# Patient Record
Sex: Female | Born: 1950 | Race: White | Hispanic: No | Smoking: Current every day smoker
Health system: Southern US, Community
[De-identification: ages and names within clinical notes are randomized; demographics above are authoritative.]

## PROBLEM LIST (undated history)

## (undated) DIAGNOSIS — R5382 Chronic fatigue, unspecified: Secondary | ICD-10-CM

## (undated) DIAGNOSIS — M797 Fibromyalgia: Secondary | ICD-10-CM

## (undated) DIAGNOSIS — Z808 Family history of malignant neoplasm of other organs or systems: Secondary | ICD-10-CM

## (undated) DIAGNOSIS — T8859XA Other complications of anesthesia, initial encounter: Secondary | ICD-10-CM

## (undated) DIAGNOSIS — J449 Chronic obstructive pulmonary disease, unspecified: Secondary | ICD-10-CM

## (undated) DIAGNOSIS — G9332 Myalgic encephalomyelitis/chronic fatigue syndrome: Secondary | ICD-10-CM

## (undated) DIAGNOSIS — M810 Age-related osteoporosis without current pathological fracture: Secondary | ICD-10-CM

## (undated) DIAGNOSIS — C50919 Malignant neoplasm of unspecified site of unspecified female breast: Secondary | ICD-10-CM

## (undated) DIAGNOSIS — Z85828 Personal history of other malignant neoplasm of skin: Secondary | ICD-10-CM

## (undated) HISTORY — PX: TONSILLECTOMY: SUR1361

## (undated) HISTORY — PX: EYE SURGERY: SHX253

## (undated) HISTORY — PX: DILATION AND CURETTAGE OF UTERUS: SHX78

## (undated) HISTORY — PX: TUBAL LIGATION: SHX77

## (undated) HISTORY — DX: Family history of malignant neoplasm of other organs or systems: Z80.8

## (undated) HISTORY — PX: ABDOMINAL HYSTERECTOMY: SHX81

## (undated) HISTORY — PX: INCONTINENCE SURGERY: SHX676

## (undated) HISTORY — DX: Personal history of other malignant neoplasm of skin: Z85.828

## (undated) HISTORY — PX: TOTAL MASTECTOMY: SHX6129

---

## 1983-01-20 HISTORY — PX: OTHER SURGICAL HISTORY: SHX169

## 1996-01-20 HISTORY — PX: ABDOMINAL HYSTERECTOMY: SHX81

## 2012-08-08 DIAGNOSIS — R0602 Shortness of breath: Secondary | ICD-10-CM

## 2015-01-16 ENCOUNTER — Other Ambulatory Visit: Payer: Self-pay | Admitting: Family Medicine

## 2015-01-16 DIAGNOSIS — R921 Mammographic calcification found on diagnostic imaging of breast: Secondary | ICD-10-CM

## 2015-01-22 ENCOUNTER — Ambulatory Visit
Admission: RE | Admit: 2015-01-22 | Discharge: 2015-01-22 | Disposition: A | Discharge status: Home / Self Care | Payer: Medicare HMO | Source: Ambulatory Visit | Attending: Family Medicine | Admitting: Family Medicine

## 2015-01-22 DIAGNOSIS — R921 Mammographic calcification found on diagnostic imaging of breast: Secondary | ICD-10-CM

## 2015-03-14 DIAGNOSIS — F419 Anxiety disorder, unspecified: Secondary | ICD-10-CM | POA: Insufficient documentation

## 2015-03-14 DIAGNOSIS — C50912 Malignant neoplasm of unspecified site of left female breast: Secondary | ICD-10-CM | POA: Insufficient documentation

## 2015-03-14 DIAGNOSIS — M810 Age-related osteoporosis without current pathological fracture: Secondary | ICD-10-CM | POA: Insufficient documentation

## 2015-10-16 ENCOUNTER — Other Ambulatory Visit (HOSPITAL_COMMUNITY): Payer: Self-pay | Admitting: Oncology

## 2015-12-18 ENCOUNTER — Ambulatory Visit (HOSPITAL_COMMUNITY): Payer: Medicare HMO | Admitting: Oncology

## 2015-12-18 ENCOUNTER — Ambulatory Visit (HOSPITAL_COMMUNITY): Payer: Medicare HMO

## 2015-12-20 ENCOUNTER — Encounter (HOSPITAL_COMMUNITY): Payer: Medicare HMO | Admitting: Hematology & Oncology

## 2016-01-27 ENCOUNTER — Ambulatory Visit (HOSPITAL_COMMUNITY): Payer: Medicare HMO | Admitting: Hematology & Oncology

## 2016-02-17 ENCOUNTER — Ambulatory Visit (HOSPITAL_COMMUNITY): Payer: Medicare HMO

## 2016-03-12 ENCOUNTER — Encounter (HOSPITAL_COMMUNITY): Payer: Self-pay | Admitting: Lab

## 2016-03-12 ENCOUNTER — Encounter (HOSPITAL_COMMUNITY): Payer: Medicare HMO | Attending: Oncology | Admitting: Oncology

## 2016-03-12 ENCOUNTER — Encounter (HOSPITAL_COMMUNITY): Payer: Medicare HMO

## 2016-03-12 ENCOUNTER — Encounter (HOSPITAL_COMMUNITY): Payer: Self-pay

## 2016-03-12 VITALS — BP 119/52 | HR 73 | Temp 98.5°F | Resp 18 | Ht 59.0 in | Wt 147.1 lb

## 2016-03-12 DIAGNOSIS — M170 Bilateral primary osteoarthritis of knee: Secondary | ICD-10-CM | POA: Diagnosis not present

## 2016-03-12 DIAGNOSIS — Z90710 Acquired absence of both cervix and uterus: Secondary | ICD-10-CM | POA: Insufficient documentation

## 2016-03-12 DIAGNOSIS — F17200 Nicotine dependence, unspecified, uncomplicated: Secondary | ICD-10-CM | POA: Insufficient documentation

## 2016-03-12 DIAGNOSIS — Z9889 Other specified postprocedural states: Secondary | ICD-10-CM | POA: Diagnosis not present

## 2016-03-12 DIAGNOSIS — M797 Fibromyalgia: Secondary | ICD-10-CM | POA: Insufficient documentation

## 2016-03-12 DIAGNOSIS — N3020 Other chronic cystitis without hematuria: Secondary | ICD-10-CM | POA: Insufficient documentation

## 2016-03-12 DIAGNOSIS — Z888 Allergy status to other drugs, medicaments and biological substances status: Secondary | ICD-10-CM | POA: Insufficient documentation

## 2016-03-12 DIAGNOSIS — K219 Gastro-esophageal reflux disease without esophagitis: Secondary | ICD-10-CM | POA: Diagnosis not present

## 2016-03-12 DIAGNOSIS — Z853 Personal history of malignant neoplasm of breast: Secondary | ICD-10-CM | POA: Diagnosis not present

## 2016-03-12 DIAGNOSIS — F17210 Nicotine dependence, cigarettes, uncomplicated: Secondary | ICD-10-CM | POA: Diagnosis not present

## 2016-03-12 DIAGNOSIS — F419 Anxiety disorder, unspecified: Secondary | ICD-10-CM | POA: Insufficient documentation

## 2016-03-12 DIAGNOSIS — Z79899 Other long term (current) drug therapy: Secondary | ICD-10-CM | POA: Diagnosis not present

## 2016-03-12 DIAGNOSIS — Z720 Tobacco use: Secondary | ICD-10-CM

## 2016-03-12 DIAGNOSIS — J449 Chronic obstructive pulmonary disease, unspecified: Secondary | ICD-10-CM | POA: Insufficient documentation

## 2016-03-12 DIAGNOSIS — I10 Essential (primary) hypertension: Secondary | ICD-10-CM

## 2016-03-12 DIAGNOSIS — C50912 Malignant neoplasm of unspecified site of left female breast: Secondary | ICD-10-CM | POA: Diagnosis not present

## 2016-03-12 DIAGNOSIS — J309 Allergic rhinitis, unspecified: Secondary | ICD-10-CM | POA: Insufficient documentation

## 2016-03-12 DIAGNOSIS — Z9012 Acquired absence of left breast and nipple: Secondary | ICD-10-CM | POA: Diagnosis not present

## 2016-03-12 DIAGNOSIS — J3089 Other allergic rhinitis: Secondary | ICD-10-CM

## 2016-03-12 LAB — CBC WITH DIFFERENTIAL/PLATELET
BASOS ABS: 0 10*3/uL (ref 0.0–0.1)
Basophils Relative: 0 %
Eosinophils Absolute: 0.1 10*3/uL (ref 0.0–0.7)
Eosinophils Relative: 1 %
HEMATOCRIT: 40.9 % (ref 36.0–46.0)
Hemoglobin: 13.3 g/dL (ref 12.0–15.0)
LYMPHS ABS: 3.3 10*3/uL (ref 0.7–4.0)
LYMPHS PCT: 53 %
MCH: 30.2 pg (ref 26.0–34.0)
MCHC: 32.5 g/dL (ref 30.0–36.0)
MCV: 93 fL (ref 78.0–100.0)
Monocytes Absolute: 0.5 10*3/uL (ref 0.1–1.0)
Monocytes Relative: 8 %
Neutro Abs: 2.4 10*3/uL (ref 1.7–7.7)
Neutrophils Relative %: 38 %
Platelets: 257 10*3/uL (ref 150–400)
RBC: 4.4 MIL/uL (ref 3.87–5.11)
RDW: 12.3 % (ref 11.5–15.5)
WBC: 6.3 10*3/uL (ref 4.0–10.5)

## 2016-03-12 LAB — COMPREHENSIVE METABOLIC PANEL
ALK PHOS: 57 U/L (ref 38–126)
ALT: 22 U/L (ref 14–54)
AST: 31 U/L (ref 15–41)
Albumin: 4.3 g/dL (ref 3.5–5.0)
Anion gap: 7 (ref 5–15)
BILIRUBIN TOTAL: 0.4 mg/dL (ref 0.3–1.2)
BUN: 9 mg/dL (ref 6–20)
CALCIUM: 9.5 mg/dL (ref 8.9–10.3)
CHLORIDE: 98 mmol/L — AB (ref 101–111)
CO2: 30 mmol/L (ref 22–32)
CREATININE: 0.56 mg/dL (ref 0.44–1.00)
GFR calc Af Amer: >60 mL/min (ref >60)
Glucose, Bld: 95 mg/dL (ref 65–99)
Potassium: 4.4 mmol/L (ref 3.5–5.1)
Sodium: 135 mmol/L (ref 135–145)
Total Protein: 7.2 g/dL (ref 6.5–8.1)

## 2016-03-12 NOTE — Patient Instructions (Addendum)
[place] at [facility] Discharge Instructions  RECOMMENDATIONS MADE BY THE CONSULTANT AND ANY TEST RESULTS WILL BE SENT TO YOUR REFERRING PHYSICIAN.  You were seen today by Dr. Twana First You will get labs drawn today, we will call you with results We will refer you to plastic surgeon Mammogram will be scheduled for next week Follow up in 3 months See Amy up front for appointments   Thank you for choosing [place] at [facility] to provide your oncology and hematology care.  To afford each patient quality time with our provider, please arrive at least 15 minutes before your scheduled appointment time.    If you have a lab appointment with the [facility] please come in thru the  Main Entrance and check in at the main information desk  You need to re-schedule your appointment should you arrive 10 or more minutes late.  We strive to give you quality time with our providers, and arriving late affects you and other patients whose appointments are after yours.  Also, if you no show three or more times for appointments you may be dismissed from the clinic at the providers discretion.     Again, thank you for choosing [facility].  Our hope is that these requests will decrease the amount of time that you wait before being seen by our physicians.       _____________________________________________________________  Should you have questions after your visit to [facility], please contact our office at [phone] between the hours of 8:30 a.m. and 4:30 p.m.  Voicemails left after 4:30 p.m. will not be returned until the following business day.  For prescription refill requests, have your pharmacy contact our office.       Resources For Cancer Patients and their Caregivers ? American Cancer Society: Can assist with transportation, wigs, general needs, runs Look Good Feel Better.        8567985924 ? Cancer  Care: Provides financial assistance, online support groups, medication/co-pay assistance.  1-800-813-HOPE 252-690-9619) ? [place] Assists [place] cancer patients and their families through emotional , educational and financial support.  (515)696-3529 ? [place] DSS Where to apply for food stamps, Medicaid and utility assistance. 403-555-3132 ? RCATS: Transportation to medical appointments. (702)549-8011 ? Social Security Administration: May apply for disability if have a Stage IV cancer. 413-657-2474 979-508-8011 ? [employer], Disability and Transit Services: Assists with nutrition, care and transit needs. [facility] Support Programs: @10RELATIVEDAYS @ > Cancer Support Group  2nd Tuesday of the month 1pm-2pm, Journey Room  > Creative Journey  3rd Tuesday of the month 1130am-1pm, Journey Room  > Look Good Feel Better  1st Wednesday of the month 10am-12 noon, Journey Room (Call [facility] to register [phone])

## 2016-03-12 NOTE — Progress Notes (Signed)
[place]  CONSULT NOTE  No care team member to display  CHIEF COMPLAINTS/PURPOSE OF CONSULTATION:  Left breast cancer stage IA ([id number])  HISTORY OF PRESENTING ILLNESS:  Virginia Blake 66 y.o. female is here because of referral by Dr. 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   Oncologic History from [facility]:   01/09/2015: She had a digital diagnostic left mammogram ultrasound of the left breast done which showed a hypoechoic oval mass with posterior acoustic shadowing. It measures 7.5 x 6.1 x 6.2 mm. It lies in the 9:30 o'clock position of the left breast, 3 cm from the nipple.    01/16/15: Ultrasound guided biopsy of mass which demonstrated invasive ductal carcinoma.   02/05/15: Left mastectomy and SLN biopsy by Dr. Burke Keels. Surgical path: invasive ductal carcinoma, well differentiated 0.6 cm in maximal dimension, coming to 1.2 cm of the closest (deep) resection margin. +LVI. G2. ER/PR + HER2 negative. 0/1 SLN positive. (pT1b pN0)  Patient was told to start adjuvant endocrine therapy with tamoxifen however she stated she read the package insert and saw all the side effects and decided to decline therapy. She currently still does not want any adjuvant endocrine therapy. She has done well post mastectomy. She has not had a mammogram of her right breast in the past year. She denies noting any right breast mass or subcutaneous nodules on her left chest wall. She states she recently had the flu a few weeks ago and is still recovering from that. Otherwise she has no complaints. She continues to smoke 1 ppd.      MEDICAL HISTORY:   HTN COPD Left breast CA s/p left total mastectomy with SLN bx Allergic rhinitis osteoporosis  SURGICAL HISTORY: Past Surgical History:  Procedure Laterality Date  . ABDOMINAL HYSTERECTOMY    . INCONTINENCE SURGERY    . TONSILLECTOMY    . TOTAL MASTECTOMY Left   . TUBAL LIGATION     SOCIAL HISTORY: Social History   Social History  . Marital status: Divorced    Spouse name: N/A  . Number of children: N/A  . Years of education: N/A   Occupational History  . Not on file.   Social History Main Topics  . Smoking status: Not on file  . Smokeless tobacco: Not on file  . Alcohol use Not on file  . Drug use: Unknown  . Sexual activity: Not on file   Other Topics Concern  . Not on file   Social History Narrative  . No narrative on file   Divorced  No drug use Non-drinker Current smoker of 40 pk/yr and has smoked for more than 20 years.  Works part time in [place]   FAMILY HISTORY: No family history of breast or ovarian Ca.  Father:, family history unknown Mother: alive, diagnosed with hypertension  ALLERGIES:  is allergic to ciprofloxacin.  MEDICATIONS:  Current Outpatient Prescriptions  Medication Sig Dispense Refill  . ALPRAZolam (XANAX) 1 MG tablet TK 1 T PO 5 TIMES PER DAY  5  . HYDROcodone-acetaminophen (NORCO) 10-325 MG tablet TAKE 1 TABLET BY MOUTH EVERY 4 HOURS  0  . ipratropium-albuterol (DUONEB) 0.5-2.5 (3) MG/3ML SOLN U 3 ML VIA NEB QID  11  .  Multiple Vitamin (THERA) TABS Take by mouth.     No current facility-administered medications for this visit.     Review of Systems  Constitutional: Negative.   HENT: Negative.   Eyes: Negative.   Respiratory: Negative.   Cardiovascular: Negative.   Gastrointestinal: Negative.   Genitourinary: Negative.   Musculoskeletal: Negative.   Skin: Negative.   Neurological: Negative.   Endo/Heme/Allergies: Negative.   Psychiatric/Behavioral: Negative.   All other systems reviewed and are negative.  14  point ROS was done and is otherwise as detailed above or in HPI   PHYSICAL EXAMINATION: ECOG PERFORMANCE STATUS: 0 - Asymptomatic  Vitals:   03/12/16 1316  BP: (!) 119/52  Pulse: 73  Resp: 18  Temp: 98.5 F (36.9 C)   Filed Weights   03/12/16 1316  Weight: 147 lb 1.6 oz (66.7 kg)     Physical Exam  Constitutional: She is oriented to person, place, and time and well-developed, well-nourished, and in no distress. No distress.  HENT:  Head: Normocephalic and atraumatic.  Mouth/Throat: No oropharyngeal exudate.  Eyes: Conjunctivae and EOM are normal. Pupils are equal, round, and reactive to light. No scleral icterus.  Neck: Normal range of motion. Neck supple. No JVD present.  Cardiovascular: Normal rate, regular rhythm and normal heart sounds.  Exam reveals no gallop and no friction rub.   No murmur heard. Pulmonary/Chest: Effort normal and breath sounds normal. No respiratory distress. She has no wheezes. She has no rales.    Abdominal: Soft. Bowel sounds are normal. She exhibits no distension. There is no tenderness. There is no guarding.  Musculoskeletal: Normal range of motion. She exhibits no edema or tenderness.  Lymphadenopathy:    She has no cervical adenopathy.  Neurological: She is alert and oriented to person, place, and time. No cranial nerve deficit. Gait normal.  Skin: Skin is warm and dry. No rash noted. No erythema. No pallor.  Psychiatric: Affect and judgment normal.  Nursing note and vitals reviewed.     LABORATORY DATA:  I have reviewed the data as listed No results found for: WBC, HGB, HCT, MCV, PLT CMP  No results found for: NA, K, CL, CO2, GLUCOSE, BUN, CREATININE, CALCIUM, PROT, ALBUMIN, AST, ALT, ALKPHOS, BILITOT, GFRNONAA, GFRAA   RADIOGRAPHIC STUDIES: I have personally reviewed the radiological images as listed and agreed with the findings in the report. No results found.   Digital Diagnostic Left Mammogram Ultrasound Left Breast  01/09/2015  IMPRESSION:  7.5 mm left breast mass highly suspicious for a breast malignancy.     US BIOPSY BREAST CORE W/ IMAGES 01/16/2015  IMPRESSION: Ultrasound guided biopsy of the left breast mass located at the 9:30 o'clock position as dicussed above. No apparent complications.     MM POST US BIOPSY *L*  01/16/2015  IMPRESSION: Appropriate positioning of clip following left breast ultrasound-guided biopsy   CT CHEST W/O CM 04/30/2015  IMPRESSION: 1. Suboptimal study without IV contrast. No mediastinal hematoma or adenopathy. No hilar adenopathy.  2. No infiltrate or pulmonary edema. Previous nodular infiltrates in right middle lobe and lingula have resolved. Minimal perihilar bronchitic changes.  3. No pleural or pericardial effusion. Heart size is within normal limits. 4. Status post left mastectomy. No axillary adenopathy.   ASSESSMENT & PLAN:  66 year old postmenopausal female with Stage IA left breast CA s/p left total mastectomy. Refused adjuvant endocrine therapy.   PLAN: Continue surveillance at this time. Clinically NED on exam. Ordered right mammogram for yearly screening mammogram. CBC,  CMP today. Refer to plastic surgery since patient is interested in left breast reconstruction. RTC in 3 months for exam and follow up.      ORDERS PLACED FOR THIS ENCOUNTER: No orders of the defined types were placed in this encounter.   MEDICATIONS PRESCRIBED THIS ENCOUNTER: Meds ordered this encounter  Medications  . ALPRAZolam (XANAX) 1 MG tablet    Sig: TK 1 T PO 5 TIMES PER DAY    Refill:  5  . HYDROcodone-acetaminophen (NORCO) 10-325 MG tablet    Sig: TAKE 1 TABLET BY MOUTH EVERY 4 HOURS    Refill:  0  . ipratropium-albuterol (DUONEB) 0.5-2.5 (3) MG/3ML SOLN    Sig: U 3 ML VIA NEB QID    Refill:  11  . Multiple Vitamin (THERA) TABS    Sig: Take by mouth.     All questions were answered. The patient knows to call the clinic with any problems, questions  or concerns.  This document serves as a record of services personally performed by Twana First, MD. It was created on her behalf by Shirlean Mylar, a trained medical scribe. The creation of this record is based on the scribe's personal observations and the provider's statements to them. This document has been checked and approved by the attending provider.  I have reviewed the above documentation for accuracy and completeness and I agree with the above.  This note was electronically signed.    [employer]  03/12/2016 1:31 PM

## 2016-03-12 NOTE — Progress Notes (Unsigned)
Referral sent to Dr Leland Johns plastic surg/ records faxed on 2/22.  Records faxed on 2/22

## 2016-03-16 ENCOUNTER — Other Ambulatory Visit (HOSPITAL_COMMUNITY): Payer: Self-pay | Admitting: Oncology

## 2016-03-16 DIAGNOSIS — Z1231 Encounter for screening mammogram for malignant neoplasm of breast: Secondary | ICD-10-CM

## 2016-03-16 DIAGNOSIS — Z1239 Encounter for other screening for malignant neoplasm of breast: Secondary | ICD-10-CM

## 2016-03-17 ENCOUNTER — Encounter (HOSPITAL_COMMUNITY): Payer: Self-pay

## 2016-03-17 ENCOUNTER — Encounter: Payer: Self-pay | Admitting: *Deleted

## 2016-03-17 NOTE — Progress Notes (Signed)
[place] Psychosocial Distress Screening Clinical Social Work  Clinical Social Work was referred by distress screening protocol.  The patient scored a 8 on the Psychosocial Distress Thermometer which indicates severe distress. Clinical Social Worker reviewed chart and contacted pt to assess for distress and other psychosocial needs. CSW introduced self and explained role of CSW.and resources to assist. Pt reports she has ongoing anxiety concerns that she manages with xanax. She is eager to see her plastic surgeon next week. She reports she has had her pain concerns addressed by her medical team. She is not open to support groups or talking with others, "unless they are a professional". She reports to have support from friends.  Pt appreciated call and agrees to reach out as needed.     ONCBCN DISTRESS SCREENING 03/12/2016  Screening Type Initial Screening  Distress experienced in past week (1-10) 8  Family Problem type Other (comment)  Emotional problem type Nervousness/Anxiety  Physical Problem type Pain;Loss of appetitie;Constipation/diarrhea;Skin dry/itchy;Other (comment)  Physician notified of physical symptoms Yes  Referral to clinical psychology No  Referral to clinical social work Yes  Referral to dietition No  Referral to financial advocate No  Referral to support programs No  Referral to palliative care No    Clinical Social Worker follow up needed: No.  If yes, follow up plan:  Virginia Blake, [place], OSW-C [place] Tuesdays   [phone]

## 2016-03-19 ENCOUNTER — Ambulatory Visit (HOSPITAL_COMMUNITY): Payer: Self-pay

## 2016-03-20 ENCOUNTER — Ambulatory Visit (HOSPITAL_COMMUNITY)
Admission: RE | Admit: 2016-03-20 | Discharge: 2016-03-20 | Disposition: A | Discharge status: Home / Self Care | Payer: Medicare HMO | Source: Ambulatory Visit | Attending: Oncology | Admitting: Oncology

## 2016-03-20 ENCOUNTER — Encounter (HOSPITAL_COMMUNITY): Payer: Self-pay | Admitting: Radiology

## 2016-03-20 DIAGNOSIS — Z1231 Encounter for screening mammogram for malignant neoplasm of breast: Secondary | ICD-10-CM | POA: Diagnosis not present

## 2016-03-20 HISTORY — DX: Malignant neoplasm of unspecified site of unspecified female breast: C50.919

## 2016-04-02 DIAGNOSIS — Z9012 Acquired absence of left breast and nipple: Secondary | ICD-10-CM | POA: Insufficient documentation

## 2016-04-02 DIAGNOSIS — F17200 Nicotine dependence, unspecified, uncomplicated: Secondary | ICD-10-CM | POA: Insufficient documentation

## 2016-06-10 ENCOUNTER — Ambulatory Visit (HOSPITAL_COMMUNITY): Payer: Self-pay | Admitting: Adult Health

## 2016-06-10 ENCOUNTER — Other Ambulatory Visit (HOSPITAL_COMMUNITY): Payer: Self-pay

## 2016-07-01 ENCOUNTER — Encounter (HOSPITAL_COMMUNITY): Payer: Self-pay | Admitting: Adult Health

## 2016-07-01 ENCOUNTER — Encounter (HOSPITAL_COMMUNITY): Payer: Medicare HMO | Attending: Adult Health | Admitting: Adult Health

## 2016-07-01 VITALS — BP 137/51 | HR 77 | Temp 98.2°F | Resp 18 | Wt 146.8 lb

## 2016-07-01 DIAGNOSIS — C50912 Malignant neoplasm of unspecified site of left female breast: Secondary | ICD-10-CM

## 2016-07-01 DIAGNOSIS — N390 Urinary tract infection, site not specified: Secondary | ICD-10-CM | POA: Diagnosis not present

## 2016-07-01 DIAGNOSIS — F17200 Nicotine dependence, unspecified, uncomplicated: Secondary | ICD-10-CM | POA: Diagnosis not present

## 2016-07-01 DIAGNOSIS — Z170 Estrogen receptor positive status [ER+]: Secondary | ICD-10-CM

## 2016-07-01 DIAGNOSIS — Z780 Asymptomatic menopausal state: Secondary | ICD-10-CM

## 2016-07-01 NOTE — Patient Instructions (Signed)
[facility] at [facility] Discharge Instructions  RECOMMENDATIONS MADE BY THE CONSULTANT AND ANY TEST RESULTS WILL BE SENT TO YOUR REFERRING PHYSICIAN.  You saw Gretchen [facility], NP, today See Danielle at checkout for appointments.   Thank you for choosing [facility] at [facility] to provide your oncology and hematology care.  To afford each patient quality time with our provider, please arrive at least 15 minutes before your scheduled appointment time.    If you have a lab appointment with the Cancer Center please come in thru the  Main Entrance and check in at the main information desk  You need to re-schedule your appointment should you arrive 10 or more minutes late.  We strive to give you quality time with our providers, and arriving late affects you and other patients whose appointments are after yours.  Also, if you no show three or more times for appointments you may be dismissed from the clinic at the providers discretion.     Again, thank you for choosing [facility].  Our hope is that these requests will decrease the amount of time that you wait before being seen by our physicians.       _____________________________________________________________  Should you have questions after your visit to [facility], please contact our office at [phone] between the hours of 8:30 a.m. and 4:30 p.m.  Voicemails left after 4:30 p.m. will not be returned until the following business day.  For prescription refill requests, have your pharmacy contact our office.       Resources For Cancer Patients and their Caregivers ? American Cancer Society: Can assist with transportation, wigs, general needs, runs Look Good Feel Better.        1-888-227-6333 ? Cancer Care: Provides financial assistance, online support groups, medication/co-pay assistance.  1-800-813-HOPE (4673) ? [facility] Assists  [place] cancer patients and their families through emotional , educational and financial support.  336-427-4357 ? [place] DSS Where to apply for food stamps, Medicaid and utility assistance. 336-342-1394 ? [employer]: Transportation to medical appointments. 336-347-2287 ? Social Security Administration: May apply for disability if have a Stage IV cancer. 336-342-7796 1-800-772-1213 ? [place] A[employer], Disability and Transit Services: Assists with nutrition, care and transit needs. 336-349-2343  Cancer Center Support Programs: @10RELATIVEDAYS@ > Cancer Support Group  2nd Tuesday of the month 1pm-2pm, Journey Room  > Creative Journey  3rd Tuesday of the month 1130am-1pm, Journey Room  > Look Good Feel Better  1st Wednesday of the month 10am-12 noon, Journey Room (Call American Cancer [employer] to register 1-800-395-5775)    

## 2016-07-02 ENCOUNTER — Encounter (HOSPITAL_COMMUNITY): Payer: Self-pay | Admitting: Lab

## 2016-07-02 NOTE — Progress Notes (Unsigned)
Referral sent to [facility]. Records faxed on 6/14

## 2016-07-02 NOTE — Progress Notes (Signed)
[place], [place] [address]   CLINIC:  Medical Oncology/Hematology  PCP:  No primary care provider on file. No primary provider on file. 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 Laverle Patter note on 03/12/16)      INTERVAL HISTORY:  Ms. Veale 66 y.o. female returns for routine follow-up for history of left breast cancer.   Overall, she tells me she has been "doing pretty good."  She has quite a bit of fatigue, and dorsi her energy levels are about 50%. Her appetite is good at 75%. She has occasional back pain, and rates pain today 4/10. She attributes this to her back pain to recurrent bladder/urinary infections. Denies any dysuria or hematuria. She has occasional stress urinary incontinence. She tells me that when she takes Azo, her back pain often improves. She is wondering if she should see a urologist.  She denies any breast concerns, including lumps or masses. She denies any skin changes to her breasts or chest wall. She tells me that she saw a plastic surgeon recently for consideration for breast reconstruction, but after consultation and learning more about the extent of the surgery she is no longer interested in this. She would like to consider a mastectomy bra with prosthesis. She remains on observation alone at this time; she declined tamoxifen or anti-estrogen therapy given potential side effects.  She has chronic issues and comorbid conditions including respiratory problems, fibromyalgia, dizziness, weakness, and occasional trouble swallowing. She has several specialists to help manage these conditions. Unfortunately, she is between primary care providers, as her PCP, Dr. Wenda Overland recently tragically passed away. She is looking for a new primary care provider.  She continues to smoke 1-1.5 packs per day. Her  biggest trigger to smoke a stress and anxiety. She tells me she has "felt pretty down lately." She attributes some of this to her adjustment from having cancer, as well as her other comorbid conditions. She takes Xanax for anxiety. Denies suicidal ideation.    REVIEW OF SYSTEMS:  Review of Systems  Constitutional: Positive for fatigue.  HENT:   Positive for trouble swallowing.   Eyes: Negative.   Respiratory: Negative.   Cardiovascular: Negative.   Gastrointestinal: Negative.  Negative for abdominal pain, blood in stool, constipation, diarrhea, nausea and vomiting.  Endocrine: Negative.   Genitourinary: Positive for bladder incontinence. Negative for dysuria and hematuria.   Musculoskeletal: Positive for back pain.  Skin: Negative.   Neurological: Positive for dizziness.  Hematological: Negative.   Psychiatric/Behavioral: Positive for depression. Negative for suicidal ideas. The patient is nervous/anxious.      PAST MEDICAL/SURGICAL HISTORY:  Past Medical History:  Diagnosis Date  . Breast cancer [facility])    left mastectomy  2017   Past Surgical History:  Procedure Laterality Date  . ABDOMINAL HYSTERECTOMY    . INCONTINENCE SURGERY    . TONSILLECTOMY    . TOTAL MASTECTOMY Left   . TUBAL LIGATION       SOCIAL HISTORY:  Social History   Social History  . Marital status: Divorced    Spouse name: N/A  . Number of children: N/A  . Years of education: N/A   Occupational History  . Not on file.   Social History Main Topics  . Smoking status: Current Every Day Smoker    Packs/day: 1.00  Types: Cigarettes  . Smokeless tobacco: Never Used  . Alcohol use No  . Drug use: No  . Sexual activity: No   Other Topics Concern  . Not on file   Social History Narrative  . No narrative on file    FAMILY HISTORY:  History reviewed. No pertinent family history.  CURRENT MEDICATIONS:  Outpatient Encounter Prescriptions as of 07/01/2016  Medication Sig  . ALPRAZolam  (XANAX) 1 MG tablet TK 1 T PO 5 TIMES PER DAY  . HYDROcodone-acetaminophen ([place]) 10-325 MG tablet TAKE 1 TABLET BY MOUTH EVERY 4 HOURS  . ipratropium-albuterol (DUONEB) 0.5-2.5 (3) MG/3ML SOLN U 3 ML VIA NEB QID  . Multiple Vitamin (THERA) TABS Take by mouth.   No facility-administered encounter medications on file as of 07/01/2016.     ALLERGIES:  Allergies  Allergen Reactions  . Ciprofloxacin Anxiety     PHYSICAL EXAM:  ECOG Performance status: 0 - Asymptomatic   Vitals:   07/01/16 1403  BP: (!) 137/51  Pulse: 77  Resp: 18  Temp: 98.2 F (36.8 C)   Filed Weights   07/01/16 1403  Weight: 146 lb 12.8 oz (66.6 kg)    Physical Exam  Constitutional: She is oriented to person, place, and time and well-developed, well-nourished, and in no distress.  HENT:  Head: Normocephalic.  Mouth/Throat: Oropharynx is clear and moist. No oropharyngeal exudate.  Eyes: Conjunctivae are normal. Pupils are equal, round, and reactive to light. No scleral icterus.  Neck: Normal range of motion. Neck supple.  Cardiovascular: Normal rate, regular rhythm and normal heart sounds.   Pulmonary/Chest: Effort normal. No respiratory distress. She has wheezes (Expiratory wheezes).    Abdominal: Soft. Bowel sounds are normal. There is no tenderness. There is no rebound and no guarding.  Musculoskeletal: Normal range of motion. She exhibits no edema.  Lymphadenopathy:    She has no cervical adenopathy.       Right: No supraclavicular adenopathy present.       Left: No supraclavicular adenopathy present.  Neurological: She is alert and oriented to person, place, and time. No cranial nerve deficit. Gait normal.  Skin: Skin is warm and dry. No rash noted.  Psychiatric: Mood, memory, affect and judgment normal.  Nursing note and vitals reviewed.    LABORATORY DATA:  I have reviewed the labs as listed.  CBC    Component Value Date/Time   WBC 6.3 03/12/2016 1404   RBC 4.40 03/12/2016 1404   HGB  13.3 03/12/2016 1404   HCT 40.9 03/12/2016 1404   PLT 257 03/12/2016 1404   MCV 93.0 03/12/2016 1404   MCH 30.2 03/12/2016 1404   MCHC 32.5 03/12/2016 1404   RDW 12.3 03/12/2016 1404   LYMPHSABS 3.3 03/12/2016 1404   MONOABS 0.5 03/12/2016 1404   EOSABS 0.1 03/12/2016 1404   BASOSABS 0.0 03/12/2016 1404   CMP Latest Ref Rng & Units 03/12/2016  Glucose 65 - 99 mg/dL 95  BUN 6 - 20 mg/dL 9  Creatinine 0.44 - 1.00 mg/dL 0.56  Sodium 135 - 145 mmol/L 135  Potassium 3.5 - 5.1 mmol/L 4.4  Chloride 101 - 111 mmol/L 98(L)  CO2 22 - 32 mmol/L 30  Calcium 8.9 - 10.3 mg/dL 9.5  Total Protein 6.5 - 8.1 g/dL 7.2  Total Bilirubin 0.3 - 1.2 mg/dL 0.4  Alkaline Phos 38 - 126 U/L 57  AST 15 - 41 U/L 31  ALT 14 - 54 U/L 22    PENDING LABS:    DIAGNOSTIC  IMAGING:  *The following radiologic images and reports have been reviewed independently and agree with below findings.  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 in [place]. Treated with left mastectomy and sentinel lymph node biopsy by Dr. Anthony Sar. She did not require adjuvant radiation therapy. It was recommended that she start antiestrogen therapy with tamoxifen, however she initially declined due to concerns about side effects.  -She has questions regarding the role of antiestrogen plays in her history of breast cancer. We discussed the role of tamoxifen and aromatase inhibitor therapy, including the mechanism of action. Discussed with her the role estrogen plays in breast cancer growth. Antiestrogen therapy is used to help prevent recurrence. Ms. Chamber remains reluctant to try any antiestrogen therapy, given concern she read in the package insert for the tamoxifen. She understands she may be at slightly  increased risk of recurrence without taking antiestrogen therapy. She prefers to remain on a program of surveillance alone, which is reasonable. -Last unilateral right breast screening mammogram 03/20/16 negative; she will continue annual screening mammography with next imaging due in 03/2017. I placed orders at subsequent follow-up visits. -Clinical breast exam performed today and is benign. -Return to cancer center in 4 months for continued surveillance. At that visit if all remains stable, then we may consider transitioning her to every 6 month follow-up.  Breast reconstruction/cosmesis:  -Recently saw a plastic surgeon, Dr. Irene Limbo, for consideration for breast reconstruction. After meeting for her consultation, Mrs. Bloch has decided not to pursue breast reconstruction surgery. -She did request a prescription for a mastectomy probably with prostheses. I will fax order form to [facility] in [place] per her request.   Bone health:  -She is overdue for bone density. We discussed the role that antiestrogen plays in maintaining bone strength. She understands that she is at increased risk for osteopenia or osteoporosis given that she is a post-menopausal white woman with petite stature and active tobacco use.  -Encouraged her to eat a diet rich in calcium or take oral calcium supplementation with vitamin D. -Orders placed for DEXA scan today; we'll try to get this imaging completed before her next visit at the cancer center. If she requires bisphosphonate therapy, we can discuss at future follow-up visits.  Adjustment disorder with depressed mood: -Ms. Dolores shares that she has been struggling often with crying spells and feeling depressed. Some of her concerns related to her history of breast cancer in coping posttreatment; other concerns are related to her other comorbid conditions and associated anxiety/depression. Denies suicidal ideation. Provided support today with active  listening, validation of her concerns, and normalization of her feelings, and expressive supportive counseling. -She reportedly takes Xanax 1 mg 5 times per day; managed by outside provider.   Urinary stress incontinence/Frequent UTIs:  -Not likely to be related to her history of breast cancer. However, she is without a PCP at this time given the tragic death of Dr. Wenda Overland. Therefore, I will refer her to urology for evaluation and management.   Other comorbid conditions:  -Encouraged her to find a new PCP to help manage her other comorbid conditions, as well as her health maintenance and wellness. -Recommended she continue healthy diet. Encouraged her to increase  her exercise as tolerated.  Smoking cessation:  -Ms. Klas continues to smoke cigarettes; she is currently smoking about 1-1.5 packs per day; "it just depends on how stressed I am I much I smoke."  Advised her to continue working on cutting back on her tobacco use. She has had success in the past with nicotine patches, but cravings/urges cause her to smoke. Her biggest cravings are d/t stress. Cigarettes "help calm my nerves so I can get done what I need to do."  -Recommended she continue use of nicotine patches, but can use more short-acting nicotine products (like gum/lozenges) for cravings. Encouraged her to try this to see if she may be more successful in cutting down on her cigarette use. Will continue to provide support & encouragement at subsequent visits.       Dispo:  -DEXA scan sometime within the next 4 months. -Return to cancer center in 4 months for continued follow-up.   All questions were answered to patient's stated satisfaction. Encouraged patient to call with any new concerns or questions before her next visit to the cancer center and we can certain see her sooner, if needed.    Plan of care discussed with Dr. Oliva Bustard, who agrees with the above aforementioned.    Orders placed this encounter:  Orders Placed This  Encounter  Procedures  . [place], NP [place] [phone]

## 2016-09-22 ENCOUNTER — Ambulatory Visit: Payer: Medicare HMO | Admitting: Urology

## 2016-10-22 ENCOUNTER — Other Ambulatory Visit (HOSPITAL_COMMUNITY): Payer: Medicare HMO

## 2016-10-27 ENCOUNTER — Other Ambulatory Visit (HOSPITAL_COMMUNITY): Payer: Self-pay | Admitting: *Deleted

## 2016-10-27 DIAGNOSIS — C50912 Malignant neoplasm of unspecified site of left female breast: Secondary | ICD-10-CM

## 2016-10-27 DIAGNOSIS — Z170 Estrogen receptor positive status [ER+]: Principal | ICD-10-CM

## 2016-10-28 ENCOUNTER — Ambulatory Visit (HOSPITAL_COMMUNITY)
Admission: RE | Admit: 2016-10-28 | Discharge: 2016-10-28 | Disposition: A | Discharge status: Home / Self Care | Payer: Medicare HMO | Source: Ambulatory Visit | Attending: Adult Health | Admitting: Adult Health

## 2016-10-28 ENCOUNTER — Encounter (HOSPITAL_COMMUNITY): Payer: Medicare HMO

## 2016-10-28 ENCOUNTER — Encounter (HOSPITAL_COMMUNITY): Payer: Medicare HMO | Attending: Oncology

## 2016-10-28 DIAGNOSIS — Z780 Asymptomatic menopausal state: Secondary | ICD-10-CM | POA: Diagnosis present

## 2016-10-28 DIAGNOSIS — M810 Age-related osteoporosis without current pathological fracture: Secondary | ICD-10-CM | POA: Diagnosis not present

## 2016-10-28 DIAGNOSIS — Z170 Estrogen receptor positive status [ER+]: Secondary | ICD-10-CM | POA: Insufficient documentation

## 2016-10-28 DIAGNOSIS — C50912 Malignant neoplasm of unspecified site of left female breast: Secondary | ICD-10-CM | POA: Diagnosis not present

## 2016-10-28 DIAGNOSIS — F17200 Nicotine dependence, unspecified, uncomplicated: Secondary | ICD-10-CM | POA: Diagnosis present

## 2016-10-28 LAB — COMPREHENSIVE METABOLIC PANEL
ALT: 18 U/L (ref 14–54)
ANION GAP: 9 (ref 5–15)
AST: 22 U/L (ref 15–41)
Albumin: 3.3 g/dL — ABNORMAL LOW (ref 3.5–5.0)
Alkaline Phosphatase: 169 U/L — ABNORMAL HIGH (ref 38–126)
BUN: 37 mg/dL — ABNORMAL HIGH (ref 6–20)
CO2: 22 mmol/L (ref 22–32)
Calcium: 6.8 mg/dL — ABNORMAL LOW (ref 8.9–10.3)
Chloride: 105 mmol/L (ref 101–111)
Creatinine, Ser: 2.09 mg/dL — ABNORMAL HIGH (ref 0.44–1.00)
GFR, EST AFRICAN AMERICAN: 27 mL/min — AB (ref >60)
GFR, EST NON AFRICAN AMERICAN: 24 mL/min — AB (ref >60)
Glucose, Bld: 107 mg/dL — ABNORMAL HIGH (ref 65–99)
POTASSIUM: 4.7 mmol/L (ref 3.5–5.1)
Sodium: 136 mmol/L (ref 135–145)
TOTAL PROTEIN: 7.2 g/dL (ref 6.5–8.1)
Total Bilirubin: 0.6 mg/dL (ref 0.3–1.2)

## 2016-10-28 LAB — CBC WITH DIFFERENTIAL/PLATELET
BASOS PCT: 0 %
Basophils Absolute: 0 10*3/uL (ref 0.0–0.1)
Eosinophils Absolute: 0.1 10*3/uL (ref 0.0–0.7)
Eosinophils Relative: 1 %
HEMATOCRIT: 40.4 % (ref 36.0–46.0)
Hemoglobin: 13.4 g/dL (ref 12.0–15.0)
LYMPHS ABS: 3.5 10*3/uL (ref 0.7–4.0)
LYMPHS PCT: 49 %
MCH: 30.4 pg (ref 26.0–34.0)
MCHC: 33.2 g/dL (ref 30.0–36.0)
MCV: 91.6 fL (ref 78.0–100.0)
MONO ABS: 0.4 10*3/uL (ref 0.1–1.0)
MONOS PCT: 6 %
NEUTROS ABS: 3.1 10*3/uL (ref 1.7–7.7)
Neutrophils Relative %: 44 %
Platelets: 234 10*3/uL (ref 150–400)
RBC: 4.41 MIL/uL (ref 3.87–5.11)
RDW: 12.5 % (ref 11.5–15.5)
WBC: 7 10*3/uL (ref 4.0–10.5)

## 2016-11-10 ENCOUNTER — Encounter (HOSPITAL_COMMUNITY): Payer: Medicare HMO

## 2016-11-10 ENCOUNTER — Encounter (HOSPITAL_COMMUNITY): Payer: Self-pay | Admitting: Adult Health

## 2016-11-10 ENCOUNTER — Encounter (HOSPITAL_BASED_OUTPATIENT_CLINIC_OR_DEPARTMENT_OTHER): Payer: Medicare HMO | Admitting: Adult Health

## 2016-11-10 VITALS — BP 123/52 | HR 73 | Temp 98.0°F | Resp 18 | Wt 145.5 lb

## 2016-11-10 DIAGNOSIS — J42 Unspecified chronic bronchitis: Secondary | ICD-10-CM | POA: Diagnosis not present

## 2016-11-10 DIAGNOSIS — R748 Abnormal levels of other serum enzymes: Secondary | ICD-10-CM

## 2016-11-10 DIAGNOSIS — Z853 Personal history of malignant neoplasm of breast: Secondary | ICD-10-CM

## 2016-11-10 DIAGNOSIS — R770 Abnormality of albumin: Secondary | ICD-10-CM

## 2016-11-10 DIAGNOSIS — Z170 Estrogen receptor positive status [ER+]: Principal | ICD-10-CM

## 2016-11-10 DIAGNOSIS — C50912 Malignant neoplasm of unspecified site of left female breast: Secondary | ICD-10-CM

## 2016-11-10 DIAGNOSIS — F17200 Nicotine dependence, unspecified, uncomplicated: Secondary | ICD-10-CM | POA: Diagnosis not present

## 2016-11-10 DIAGNOSIS — M810 Age-related osteoporosis without current pathological fracture: Secondary | ICD-10-CM

## 2016-11-10 DIAGNOSIS — R7989 Other specified abnormal findings of blood chemistry: Secondary | ICD-10-CM | POA: Diagnosis not present

## 2016-11-10 DIAGNOSIS — Z1231 Encounter for screening mammogram for malignant neoplasm of breast: Secondary | ICD-10-CM

## 2016-11-10 LAB — CBC WITH DIFFERENTIAL/PLATELET
BASOS ABS: 0 10*3/uL (ref 0.0–0.1)
BASOS PCT: 0 %
EOS ABS: 0.1 10*3/uL (ref 0.0–0.7)
Eosinophils Relative: 1 %
HEMATOCRIT: 41.4 % (ref 36.0–46.0)
HEMOGLOBIN: 13.4 g/dL (ref 12.0–15.0)
Lymphocytes Relative: 55 %
Lymphs Abs: 4.2 10*3/uL — ABNORMAL HIGH (ref 0.7–4.0)
MCH: 30 pg (ref 26.0–34.0)
MCHC: 32.4 g/dL (ref 30.0–36.0)
MCV: 92.8 fL (ref 78.0–100.0)
Monocytes Absolute: 0.4 10*3/uL (ref 0.1–1.0)
Monocytes Relative: 5 %
NEUTROS ABS: 3 10*3/uL (ref 1.7–7.7)
NEUTROS PCT: 39 %
Platelets: 255 10*3/uL (ref 150–400)
RBC: 4.46 MIL/uL (ref 3.87–5.11)
RDW: 12.7 % (ref 11.5–15.5)
WBC: 7.6 10*3/uL (ref 4.0–10.5)

## 2016-11-10 LAB — COMPREHENSIVE METABOLIC PANEL
ALBUMIN: 4.6 g/dL (ref 3.5–5.0)
ALK PHOS: 71 U/L (ref 38–126)
ALT: 15 U/L (ref 14–54)
ANION GAP: 8 (ref 5–15)
AST: 23 U/L (ref 15–41)
BILIRUBIN TOTAL: 0.5 mg/dL (ref 0.3–1.2)
BUN: 10 mg/dL (ref 6–20)
CO2: 31 mmol/L (ref 22–32)
Calcium: 9.7 mg/dL (ref 8.9–10.3)
Chloride: 101 mmol/L (ref 101–111)
Creatinine, Ser: 0.62 mg/dL (ref 0.44–1.00)
GFR calc Af Amer: >60 mL/min (ref >60)
GFR calc non Af Amer: >60 mL/min (ref >60)
GLUCOSE: 87 mg/dL (ref 65–99)
Potassium: 4.4 mmol/L (ref 3.5–5.1)
SODIUM: 140 mmol/L (ref 135–145)
TOTAL PROTEIN: 7.3 g/dL (ref 6.5–8.1)

## 2016-11-10 NOTE — Progress Notes (Signed)
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 Laverle Blake note on 03/12/16)        INTERVAL HISTORY:  [place] 66 y.o. female returns for routine follow-up for history of left breast cancer.   From an oncologic standpoint, she has been doing well.  Appetite 75%; energy levels 50%.  Endorses occasional (L) chest wall tenderness, particularly with certain activities/stretches.  Reports occasional (R) breast tenderness as well.  Denies any mass, skin changes, or nipple discharge.  She is now taking her calcium and vitamin D supplements; reports that she had stopped for several weeks.  Recently had DEXA scan; she would like to review those results today.   She has been struggling with bronchitis "for the past few months."  She still has not found a new PCP since her previous PCP was tragically killed in a motorcycle accident.  She shares with me that she is planning on "finding me a new doctor tomorrow."  Reports occasional dizziness; admittedly she states that she has not been drinking a lot of fluids in recent weeks.  She was reportedly seen at an urgent care yesterday and told she had a sinus infection.  She has been struggling with chronic UTIs; has consultation to urologist upcoming per her report.  She has been struggling with chronic arthralgias, particularly to her knees, back, and elbows.   She continues to smoke cigarettes. She is working on cutting back.      REVIEW OF SYSTEMS:  Review of Systems  Constitutional: Positive for fatigue. Negative for chills and fever.  HENT:  Negative.   Eyes: Negative.   Respiratory: Positive  for shortness of breath.   Cardiovascular: Negative.   Gastrointestinal: Negative.   Musculoskeletal: Positive for arthralgias.  Skin: Negative.   Neurological: Positive for dizziness and extremity weakness.  Hematological: Negative.   Psychiatric/Behavioral: The patient is nervous/anxious.      PAST MEDICAL/SURGICAL HISTORY:  Past Medical History:  Diagnosis Date  . Breast cancer [facility])    left mastectomy  2017   Past Surgical History:  Procedure Laterality Date  . ABDOMINAL HYSTERECTOMY    . INCONTINENCE SURGERY    . TONSILLECTOMY    . TOTAL MASTECTOMY Left   . TUBAL LIGATION       SOCIAL HISTORY:  Social History   Social History  . Marital status: Divorced    Spouse name: N/A  . Number of children: N/A  . Years of education: N/A   Occupational History  . Not on file.   Social History Main Topics  . Smoking status: Current Every Day Smoker    Packs/day: 1.00    Types: Cigarettes  . Smokeless tobacco: Never Used  . Alcohol use No  . Drug use: No  . Sexual activity: No   Other Topics Concern  . Not on file   Social History Narrative  . No narrative on file    FAMILY HISTORY:  History reviewed. No pertinent family history.  CURRENT MEDICATIONS:  Outpatient Encounter Prescriptions as of 11/10/2016  Medication Sig  . ALPRAZolam (XANAX) 1 MG tablet TK 1 T PO 5 TIMES PER DAY  . HYDROcodone-acetaminophen ([place])  10-325 MG tablet TAKE 1 TABLET BY MOUTH EVERY 4 HOURS  . ipratropium-albuterol (DUONEB) 0.5-2.5 (3) MG/3ML SOLN U 3 ML VIA NEB QID  . Multiple Vitamin (THERA) TABS Take by mouth.   No facility-administered encounter medications on file as of 11/10/2016.     ALLERGIES:  Allergies  Allergen Reactions  . Ciprofloxacin Anxiety     PHYSICAL EXAM:  ECOG Performance status: 0 - Asymptomatic   Vitals:   11/10/16 1416  BP: (!) 123/52  Pulse: 73  Resp: 18  Temp: 98 F (36.7 C)  SpO2: 94%   Filed Weights   11/10/16 1416  Weight: 145 lb 8  oz (66 kg)    Physical Exam  Constitutional: She is oriented to person, place, and time.  Chronically-ill appearing female in no acute distress   HENT:  Head: Normocephalic.  Mouth/Throat: Oropharynx is clear and moist. No oropharyngeal exudate.  Eyes: Pupils are equal, round, and reactive to light. Conjunctivae are normal. No scleral icterus.  Neck: Normal range of motion. Neck supple.  Cardiovascular: Normal rate, regular rhythm and normal heart sounds.   Pulmonary/Chest: Effort normal. No respiratory distress. She has wheezes (Expiratory wheezes).    Abdominal: Soft. Bowel sounds are normal. There is no tenderness. There is no rebound and no guarding.  Musculoskeletal: Normal range of motion. She exhibits no edema.  Lymphadenopathy:    She has no cervical adenopathy.       Right: No supraclavicular adenopathy present.       Left: No supraclavicular adenopathy present.  Neurological: She is alert and oriented to person, place, and time. No cranial nerve deficit. Gait normal.  Skin: Skin is warm and dry. No rash noted. There is pallor.  Psychiatric: Mood, memory, affect and judgment normal.  Nursing note and vitals reviewed.    LABORATORY DATA:  I have reviewed the labs as listed.  CBC    Component Value Date/Time   WBC 7.0 10/28/2016 1351   RBC 4.41 10/28/2016 1351   HGB 13.4 10/28/2016 1351   HCT 40.4 10/28/2016 1351   PLT 234 10/28/2016 1351   MCV 91.6 10/28/2016 1351   MCH 30.4 10/28/2016 1351   MCHC 33.2 10/28/2016 1351   RDW 12.5 10/28/2016 1351   LYMPHSABS 3.5 10/28/2016 1351   MONOABS 0.4 10/28/2016 1351   EOSABS 0.1 10/28/2016 1351   BASOSABS 0.0 10/28/2016 1351   CMP Latest Ref Rng & Units 10/28/2016 03/12/2016  Glucose 65 - 99 mg/dL 935(T) 95  BUN 6 - 20 mg/dL 01(X) 9  Creatinine 7.93 - 1.00 mg/dL 9.03(E) 0.92  Sodium 330 - 145 mmol/L 136 135  Potassium 3.5 - 5.1 mmol/L 4.7 4.4  Chloride 101 - 111 mmol/L 105 98(L)  CO2 22 - 32 mmol/L 22 30  Calcium  8.9 - 10.3 mg/dL 0.7(M) 9.5  Total Protein 6.5 - 8.1 g/dL 7.2 7.2  Total Bilirubin 0.3 - 1.2 mg/dL 0.6 0.4  Alkaline Phos 38 - 126 U/L 169(H) 57  AST 15 - 41 U/L 22 31  ALT 14 - 54 U/L 18 22    PENDING LABS:    DIAGNOSTIC IMAGING:  *The following radiologic images and reports have been reviewed independently and agree with below findings.  Most recent (R) breast mammogram: 03/20/16    DEXA scan: 10/28/16 EXAM: DUAL X-RAY ABSORPTIOMETRY (DXA) FOR BONE MINERAL DENSITY  IMPRESSION: Ordering Physician:  Dr. Hubbard Hartshorn,  Your patient Shanna Un completed a BMD test on 10/28/2016 using the [facility] Prodigy DXA System (software  version: 14.10) manufactured by [employer]. The following summarizes the results of our evaluation.  PATIENT BIOGRAPHICAL: Name: EVANNE, MATSUNAGA Patient ID: [id number] Birth Date: 01-03-1951 Height: 59.0 in. Gender: Female Exam Date: 10/28/2016 Weight: 147.0 lbs. Indications: Caucasian, History of Fracture (Adult), Hx Breast Ca, Partial Hysterectomy, Post Menopausal, Tobacco User Fractures: Ribs Treatments: Calcium, Vitamin D  DENSITOMETRY RESULTS: Site      Region    Measured Date Measured Age WHO Classification Young Adult T-score BMD         %Change vs. Previous Significant Change (*)  AP Spine L1-L4 10/28/2016 65.2 Normal -1.0 1.055 g/cm2 - -  DualFemur Neck Left 10/28/2016 [age] Osteoporosis -2.9 0.640 g/cm2 - - ASSESSMENT: BMD as determined from Femur Neck Left is 0.640 g/cm2 with a T-Score of -2.9. This patient is considered osteoporotic according to World Health Organization [facility]) criteria.  World Science writer (WHO) criteria for post-menopausal, Caucasian Women: Normal:       T-score at or above -1 SD Osteopenia:   T-score between -1 and -2.5 SD Osteoporosis: T-score at or below -2.5 SD  RECOMMENDATIONS: National Osteoporosis Foundation recommends that FDA-approved medial therapies be considered in  postmenopausal women and men age 39 or older with a: 1. Hip or vertebral (clinical or morphometric) fracture. 2. T-Score of < -2.5 at the spine or hip. 3. Ten-year fracture probability by FRAX of 3% or greater for hip fracture or 20% or greater for major osteoporotic fracture.    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 Treated with left mastectomy and sentinel lymph node biopsy by Dr. Gabriel Cirri. She did not require adjuvant radiation therapy. It was recommended that she start antiestrogen therapy with tamoxifen, however she initially declined due to concerns about side effects.  -Has opted against anti-estrogen therapy; she remains consistent in her wishes to continue on a program of surveillance alone at this point.  -Last unilateral right breast screening mammogram 03/20/16 negative; she will continue annual screening mammography with next imaging due in 03/2017; orders placed today.  -Clinical breast exam performed today and is negative for recurrence. (R) nipple is mildly inverted and has been for quite some time per patient.  -Return to cancer center in 6 months for continued surveillance.   Left mastectomy bra/prosthesis:  -Previously faxed prescription to [facility] in [place] for mastectomy bra and prosthesis. She is interested in getting her bra/prosthesis refills, but did not go to [facility] after her last visit.   -Provided the number to the boutique for her to call and make an appointment; if they need additional orders or documentation, they will reach out to our office.    Bone health:  -Last DEXA scan completed 10/28/16 revealed osteoporosis with T-score -2.9.  Reviewed results with her in detail today.  -Discussed with her option of starting  bisphosphonate therapy with Prolia injections every 6 months. Discussed benefits/risks/side effects of bisphosphonate therapy.  Discussed Fosamax vs Prolia; she is reluctant to try either medication after hearing possible risks, including osteonecrosis of jaw.  She would like to think about the Fosamax more; she was provided written patient education materials today that she can review at her leisure. If she would like to proceed with bisphosphonate therapy, then we will reach out to  her dentist at [facility] in [place] to obtain clearance. Instructed patient to call us and let us know if she decides to proceed.   -Continue calcium/vitamin D supplementation, as well as increase weight-bearing exercises as tolerated.   Recent lab abnormalities:  -Recent elevated BUN/CRE noted on labs on 10/28/16; calcium low and albumin mildly low as well.  Alkalikne phosphatase mildly increased, when previous it has been normal.  She has been struggling with chronic bronchitis for several months. Admittedly she does not drink a lot of fluids and had stopped taking her calcium supplements for awhile.   -Will recheck labs today. Encouraged her to increase her fluids and resume calcium supplements.   Other comorbid conditions:  -Encouraged her to find a new PCP to help manage her other comorbid conditions, as well as her health maintenance and wellness. -Recommended she continue healthy diet. Encouraged her to increase her exercise as tolerated.  Smoking cessation:  -Ms. Sparano continues to smoke cigarettes; she is working on cutting back.  Encouraged smoking cessation.       Dispo:  -Unilateral (R) screening mammogram due in 03/2017; orders placed today.  -Return to cancer center in 6 months for follow-up.    All questions were answered to patient's stated satisfaction. Encouraged patient to call with any new concerns or questions before her next visit to the cancer center and we can certain see her sooner,  if needed.    Plan of care discussed with Dr. Talbert Cage, who agrees with the above aforementioned.    Orders placed this encounter:  Orders Placed This Encounter  Procedures  . MM SCREENING BREAST TOMO UNI R  . CBC with Differential/Platelet  . Comprehensive metabolic panel      [name], NP [place] [phone]

## 2016-11-10 NOTE — Patient Instructions (Addendum)
[place] at [facility] Discharge Instructions  RECOMMENDATIONS MADE BY THE CONSULTANT AND ANY TEST RESULTS WILL BE SENT TO YOUR REFERRING PHYSICIAN.  You were seen today by [name], NP You have been given information on Fosamax [phone] - this is the number to 2nd to [facility] [employer] in [place] where you can get your mastectomy bras Mammogram in march and follow up in 6 months See schedulers up front for appointments   Thank you for choosing [name] at [facility] to provide your oncology and hematology care.  To afford each patient quality time with our provider, please arrive at least 15 minutes before your scheduled appointment time.    If you have a lab appointment with the [place] please come in thru the  Main Entrance and check in at the main information desk  You need to re-schedule your appointment should you arrive 10 or more minutes late.  We strive to give you quality time with our providers, and arriving late affects you and other patients whose appointments are after yours.  Also, if you no show three or more times for appointments you may be dismissed from the clinic at the providers discretion.     Again, thank you for choosing [name].  Our hope is that these requests will decrease the amount of time that you wait before being seen by our physicians.       _____________________________________________________________  Should you have questions after your visit to [name], please contact our office at [phone] between the hours of 8:30 a.m. and 4:30 p.m.  Voicemails left after 4:30 p.m. will not be returned until the following business day.  For prescription refill requests, have your pharmacy contact our office.       Resources For Cancer Patients and their Caregivers ? American Cancer Society: Can assist with transportation, wigs, general needs, runs Look  Good Feel Better.        2538441761 ? Cancer Care: Provides financial assistance, online support groups, medication/co-pay assistance.  1-800-813-HOPE 276 002 2629) ? [place] Assists [place] cancer patients and their families through emotional , educational and financial support.  973-020-6442 ? [place] DSS Where to apply for food stamps, Medicaid and utility assistance. (737)209-3478 ? [employer]: Transportation to medical appointments. (763)306-4312 ? Social Security Administration: May apply for disability if have a Stage IV cancer. 646-476-5236 574-653-3676 ? [employer], Disability and Transit Services: Assists with nutrition, care and transit needs. [place] Support Programs: @10RELATIVEDAYS @ > Cancer Support Group  2nd Tuesday of the month 1pm-2pm, Journey Room  > Creative Journey  3rd Tuesday of the month 1130am-1pm, Journey Room  > Look Good Feel Better  1st Wednesday of the month 10am-12 noon, Journey Room (Call [place] to register [phone])

## 2016-11-25 ENCOUNTER — Ambulatory Visit: Payer: Medicare HMO | Admitting: Urology

## 2016-11-25 DIAGNOSIS — N3021 Other chronic cystitis with hematuria: Secondary | ICD-10-CM | POA: Diagnosis not present

## 2016-11-25 DIAGNOSIS — N3946 Mixed incontinence: Secondary | ICD-10-CM

## 2017-04-12 ENCOUNTER — Ambulatory Visit (HOSPITAL_COMMUNITY): Payer: Medicare HMO

## 2017-04-15 ENCOUNTER — Ambulatory Visit (HOSPITAL_COMMUNITY)
Admission: RE | Admit: 2017-04-15 | Discharge: 2017-04-15 | Disposition: A | Discharge status: Home / Self Care | Payer: Medicare HMO | Source: Ambulatory Visit | Attending: Adult Health | Admitting: Adult Health

## 2017-04-15 DIAGNOSIS — Z1231 Encounter for screening mammogram for malignant neoplasm of breast: Secondary | ICD-10-CM | POA: Diagnosis present

## 2017-05-12 ENCOUNTER — Other Ambulatory Visit (HOSPITAL_COMMUNITY): Payer: Medicare HMO

## 2017-05-12 ENCOUNTER — Ambulatory Visit (HOSPITAL_COMMUNITY): Payer: Medicare HMO | Admitting: Adult Health

## 2017-05-18 ENCOUNTER — Other Ambulatory Visit (HOSPITAL_COMMUNITY): Payer: Self-pay | Admitting: *Deleted

## 2017-05-18 DIAGNOSIS — C50919 Malignant neoplasm of unspecified site of unspecified female breast: Secondary | ICD-10-CM

## 2017-05-19 ENCOUNTER — Other Ambulatory Visit (HOSPITAL_COMMUNITY): Payer: Medicare HMO

## 2017-05-19 ENCOUNTER — Ambulatory Visit (HOSPITAL_COMMUNITY): Payer: Medicare HMO | Admitting: Internal Medicine

## 2017-05-23 IMAGING — MG MM BREAST BX W LOC DEV 1ST LESION IMAGE BX SPEC STEREO GUIDE*L*
6 series · 6 of 22 positions shown · non-contrast
Comparison: Previous exams.

ADDENDUM:
Pathology reveals DUCTAL CARCINOMA IN SITU WITH CALCIFICATIONS of
the Left medial breast. Grade I INVASIVE DUCTAL CARCINOMA, DUCTAL
CARCINOMA IN SITU WITH CALCIFICATIONS, FIBROCYSTIC CHANGES WITH
CALCIFICATIONS of the Left central breast. This was found to be
concordant by Dr. Lauris Idris. Pathology results were discussed
with the patient via telephone. She reported tenderness at the
biopsy sites and is doing well otherwise. Post biopsy care and
instructions were reviewed and questions were answered. She was
encouraged to contact The [REDACTED] with
any additional questions and or concerns. The patient has a recent
diagnosis of Left breast cancer and was instructed to follow her
treatment plan. Pathology results were called and faxed to Dr. Charro
Sampsted of [REDACTED] [REDACTED] in [HOSPITAL][HOSPITAL].

Pathology results reported by Avram RN on January 24, 2015.
CLINICAL DATA: Known left breast invasive mammary carcinoma.
Calcifications extending medially and centrally from the mass are
present and stereotactic biopsies are requested to determine extent
of disease.
EXAM:
LEFT BREAST STEREOTACTIC CORE NEEDLE BIOPSY

[L CC (1 of 2)]
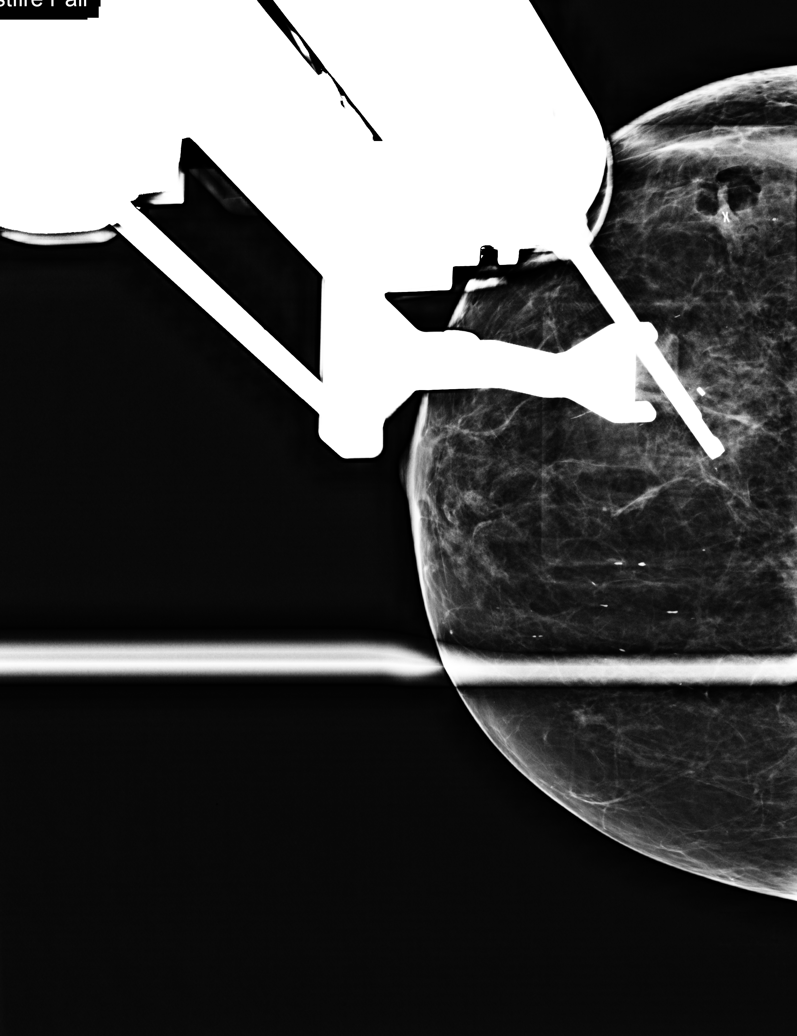

[L CC (2 of 2)]
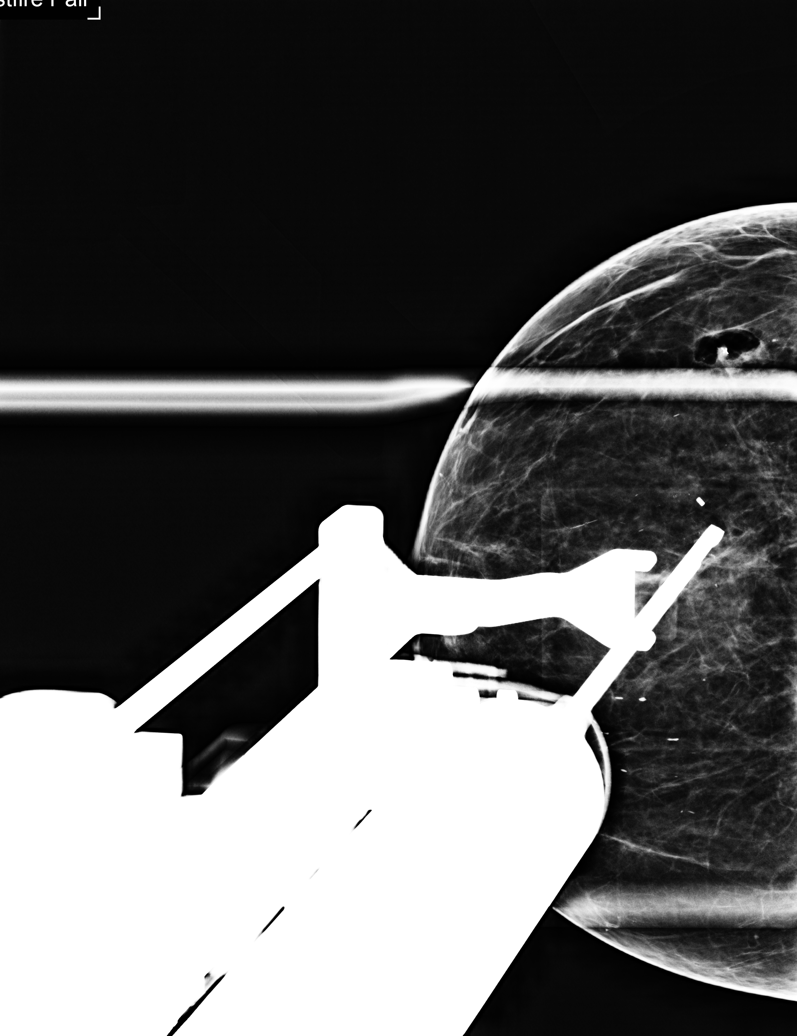

[L CC tomo (1 of 4) · tomo slice 26/51.0]
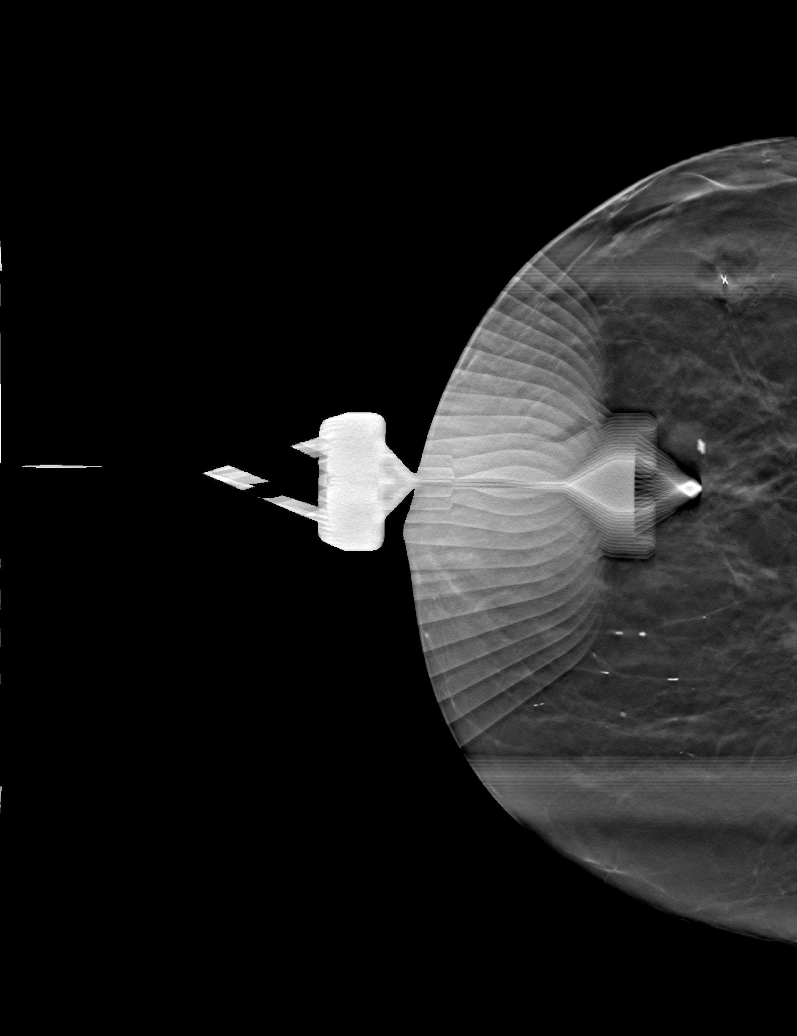

[L CC tomo (2 of 4) · tomo slice 26/51.0]
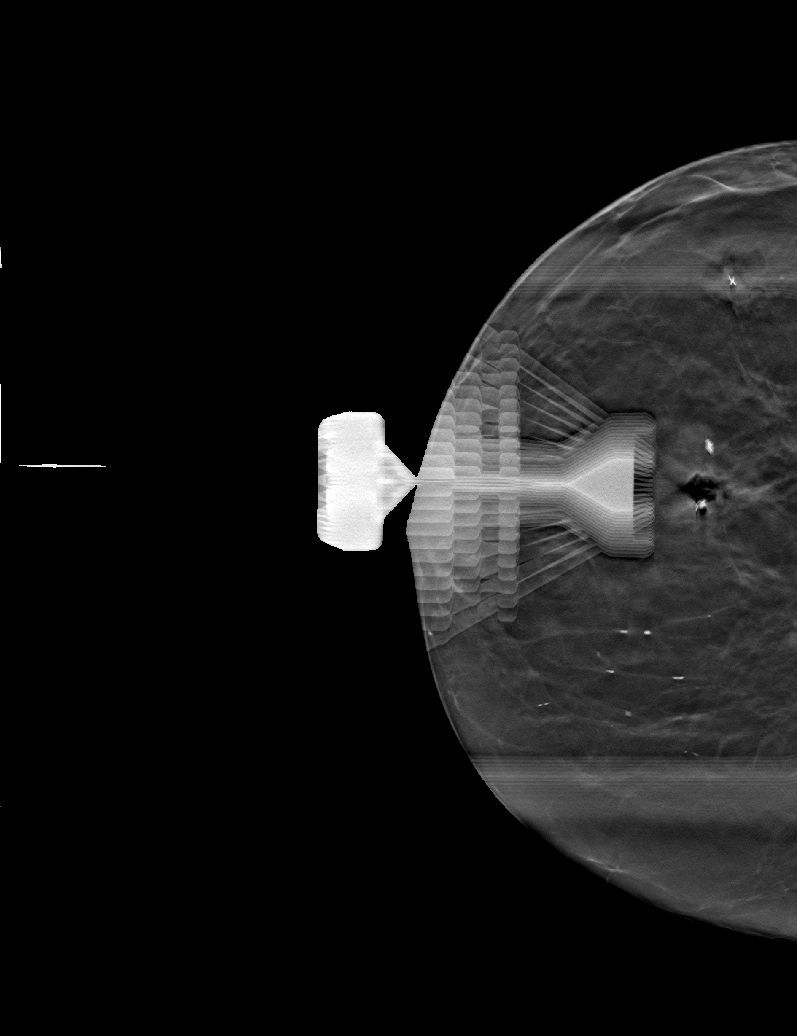

[L CC tomo (3 of 4) · tomo slice 26/51.0]
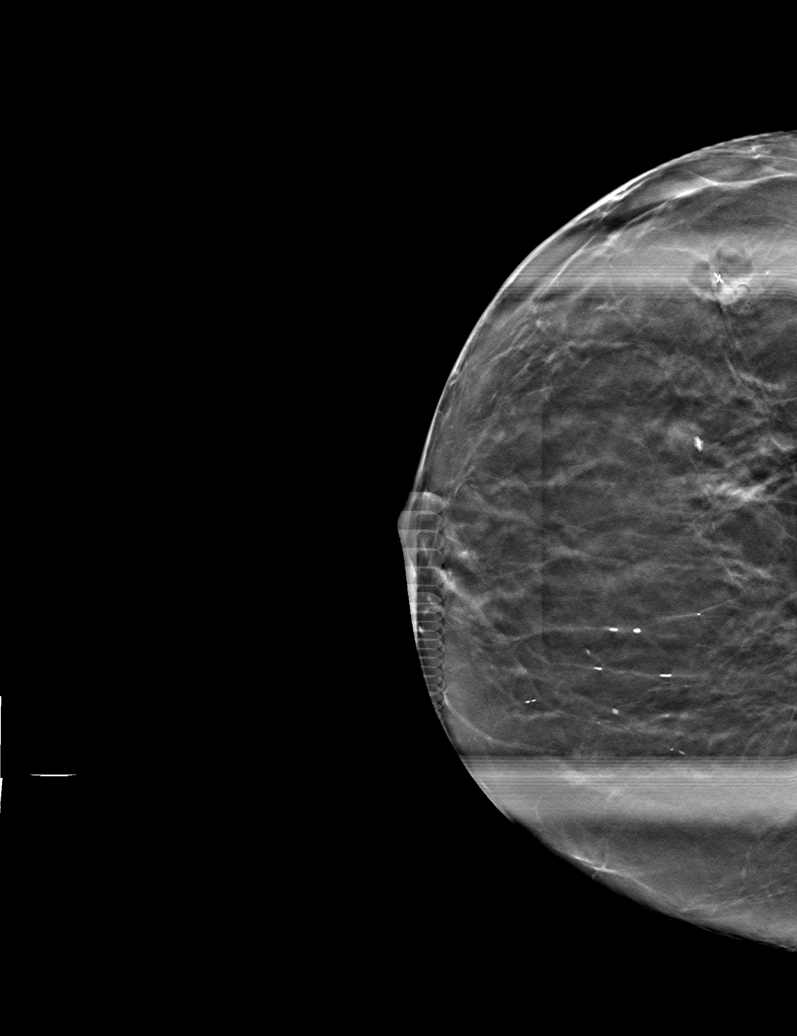

[L CC tomo (4 of 4) · tomo slice 26/51.0]
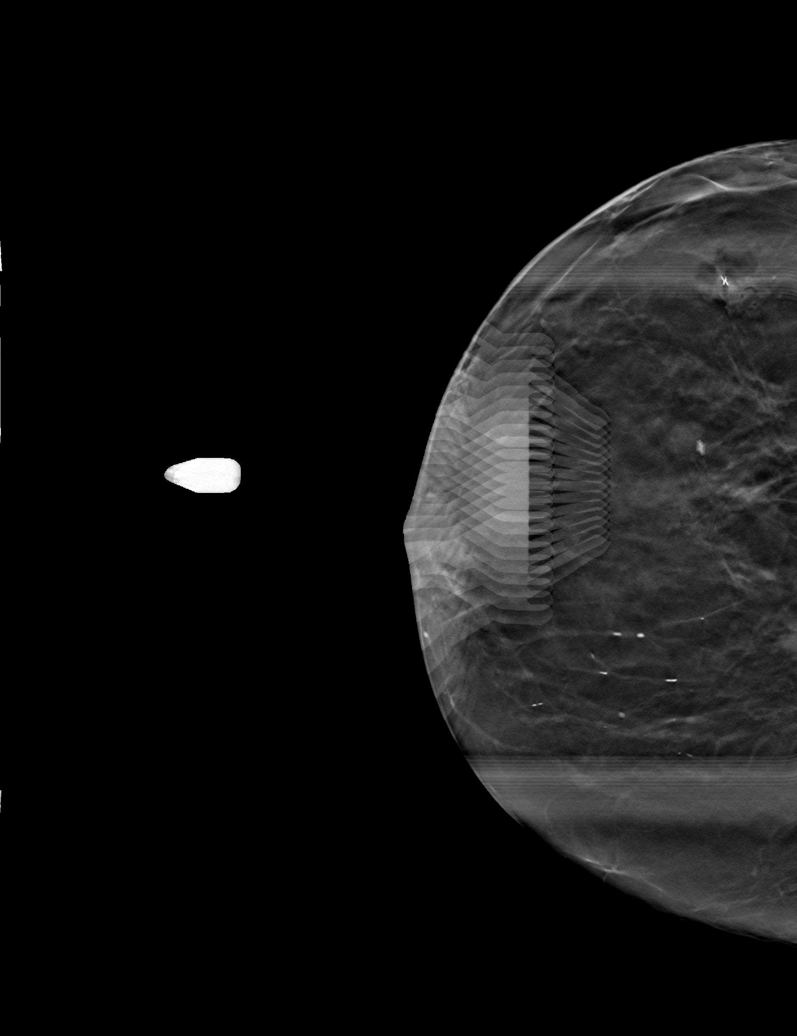

[6 of 22 positions shown; findings below may reference images not displayed]



Using sterile technique and 1% lidocaine as local anesthetic, under
stereotactic guidance, a 9 gauge vacuum assisted device was used to
perform core needle biopsy of calcifications within the medial left
breast using a superior craniocaudal approach. Specimen radiograph
was performed showing representative calcifications within the
specimen. Specimens with calcifications are identified for
pathology.

At the conclusion of the procedure, an X shaped tissue marker clip
marking the medial calcifications biopsy was deployed into the
biopsy cavity.

Following this biopsy, utilizing sterile technique and utilizing 1%
lidocaine as local anesthetic, under stereotactic guidance, a 9
gauge vacuum assisted device was used to perform core needle biopsy
of calcifications within the central left breast using a superior
craniocaudal approach. A specimen radiograph was performed showing
representative calcifications within the specimen. Specimens with
calcifications are identified for pathology.

At the conclusion of this biopsy, a coil shaped tissue marker was
deployed into the biopsy cavity marking the central calcifications.
Follow-up two view mammogram was performed for clip placement.

The procedure performed without immediate complication.
IMPRESSION: Stereotactic-guided biopsy of calcifications located within the
medial left breast and central left breast as discussed above. No
apparent complications.

## 2017-05-23 IMAGING — MG MM DIGITAL DIAGNOSTIC UNILAT*L*
2 series · 2 of 2 positions shown · non-contrast
Comparison: Previous exam(s).

CLINICAL DATA: Post left breast stereotactic biopsy.

EXAM:
DIAGNOSTIC LEFT MAMMOGRAM POST STEREOTACTIC BIOPSY

[L CC]
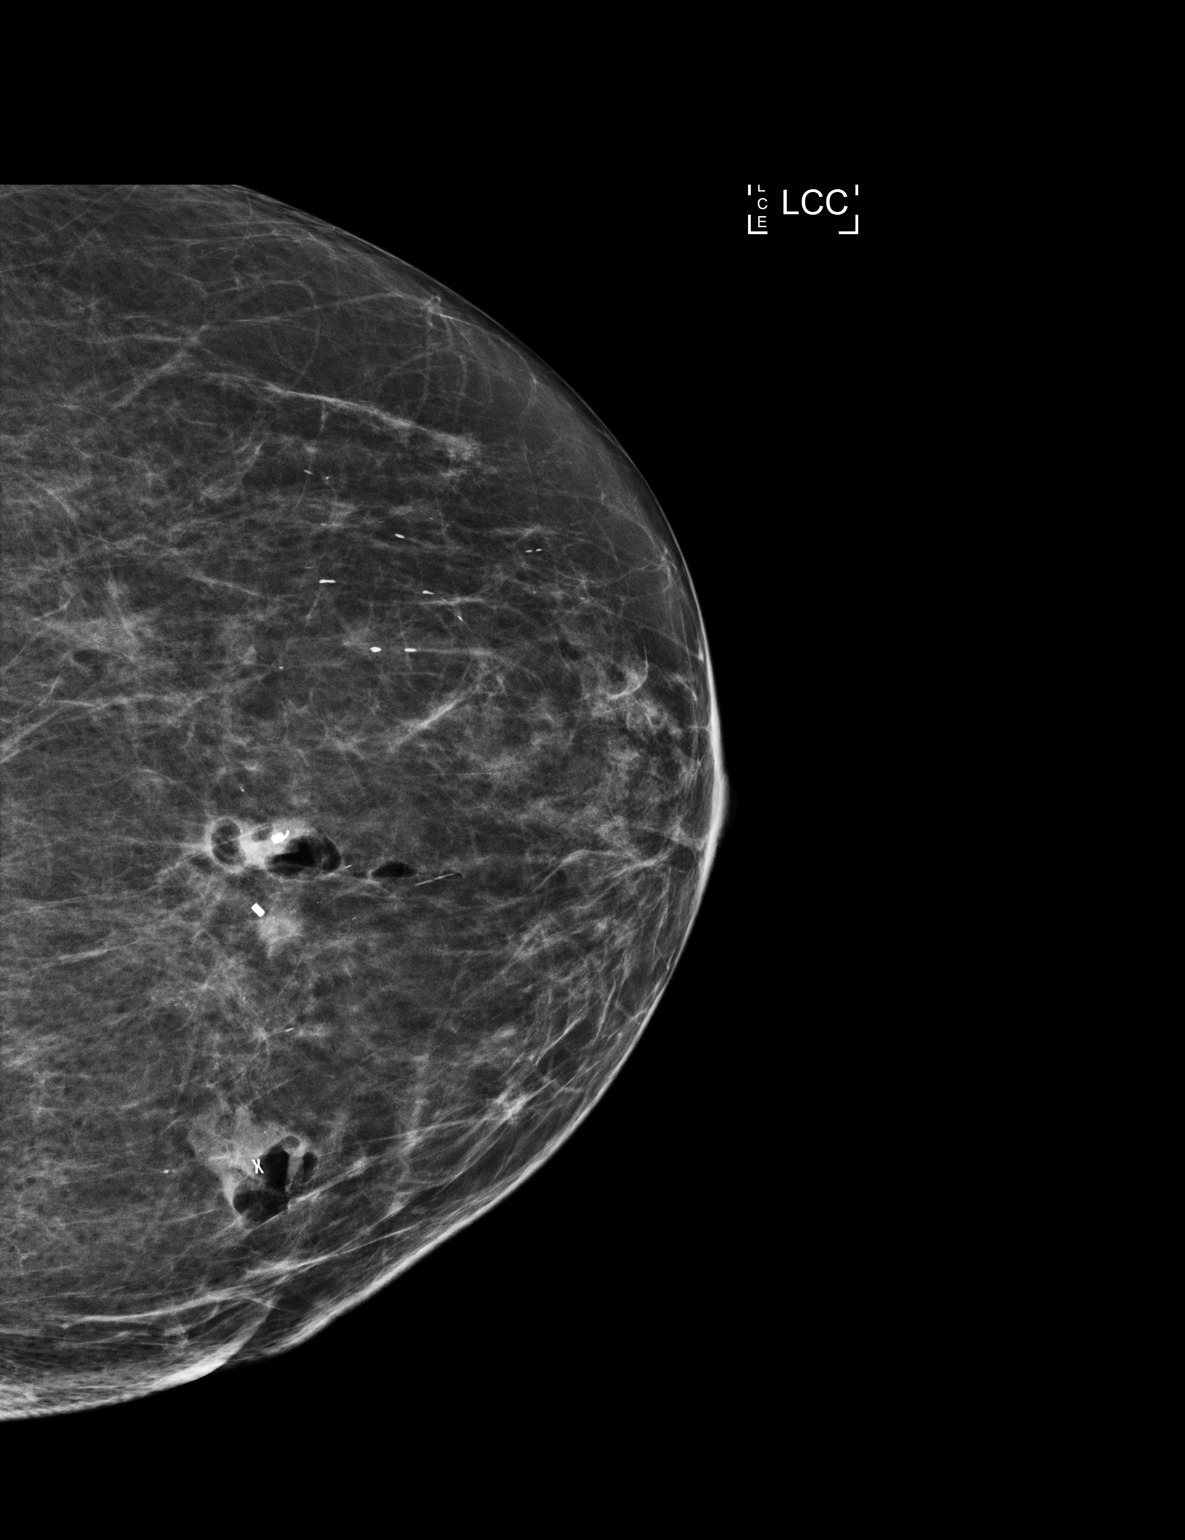

[L ML]
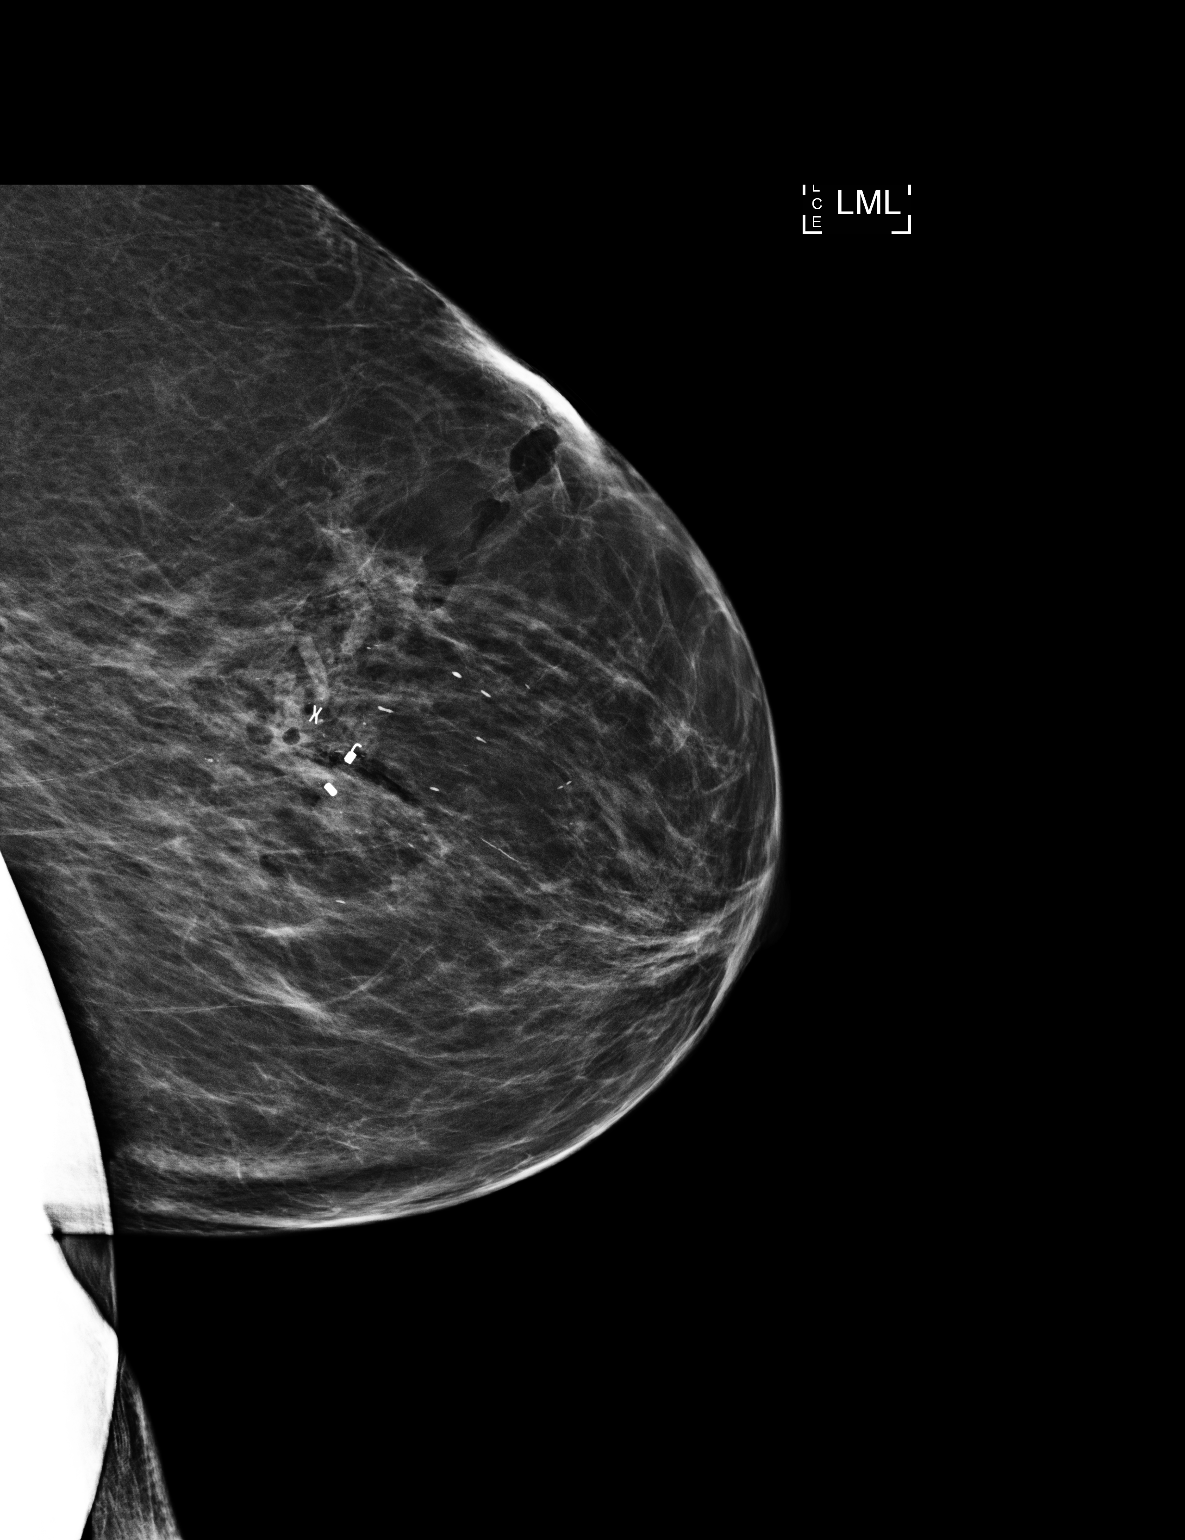

[2 of 2 positions shown; findings below may reference images not displayed]

FINDINGS: Mammographic images were obtained following stereotactic guided
biopsy of calcifications located medially within the left breast and
also centrally within the left breast. The X shaped clip marking the
medial calcification biopsy is in appropriate position. The coil
shaped clip marking the central calcifications is also in
appropriate position. In addition, there is a cylinder-shaped clip
located between these 2 clips marking the known invasive mammary
carcinoma recently biopsied under ultrasound guidance. The coil
shaped clip and the X shaped clip are separated by distance of
cm.
IMPRESSION: Appropriate positioning of clips following left breast stereotactic
biopsies as discussed above.

Final Assessment: Post Procedure Mammograms for Marker Placement

## 2017-05-23 IMAGING — MG MM BREAST BX W LOC DEV EA AD LESION IMG BX SPEC STEREO GUIDE*L*
7 series · 8 of 23 positions shown · non-contrast
Comparison: Previous exams.

ADDENDUM:
Pathology reveals DUCTAL CARCINOMA IN SITU WITH CALCIFICATIONS of
the Left medial breast. Grade I INVASIVE DUCTAL CARCINOMA, DUCTAL
CARCINOMA IN SITU WITH CALCIFICATIONS, FIBROCYSTIC CHANGES WITH
CALCIFICATIONS of the Left central breast. This was found to be
concordant by Dr. Lauris Idris. Pathology results were discussed
with the patient via telephone. She reported tenderness at the
biopsy sites and is doing well otherwise. Post biopsy care and
instructions were reviewed and questions were answered. She was
encouraged to contact The [REDACTED] with
any additional questions and or concerns. The patient has a recent
diagnosis of Left breast cancer and was instructed to follow her
treatment plan. Pathology results were called and faxed to Dr. Charro
Sampsted of [REDACTED] [REDACTED] in [HOSPITAL][HOSPITAL].

Pathology results reported by Avram RN on January 24, 2015.
CLINICAL DATA: Known left breast invasive mammary carcinoma.
Calcifications extending medially and centrally from the mass are
present and stereotactic biopsies are requested to determine extent
of disease.
EXAM:
LEFT BREAST STEREOTACTIC CORE NEEDLE BIOPSY

[L SPECIMEN]
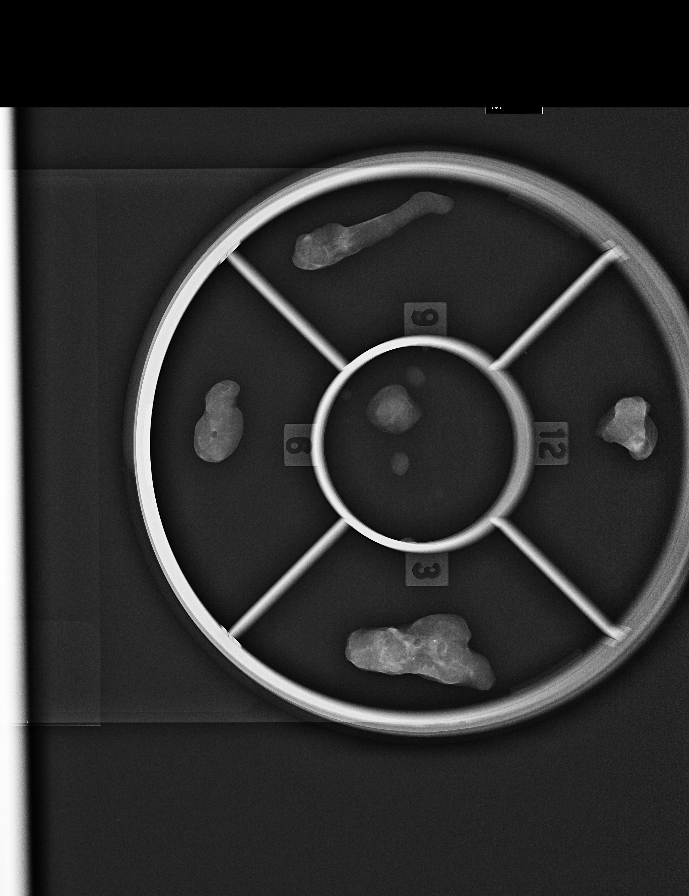

[L CC (1 of 2)]
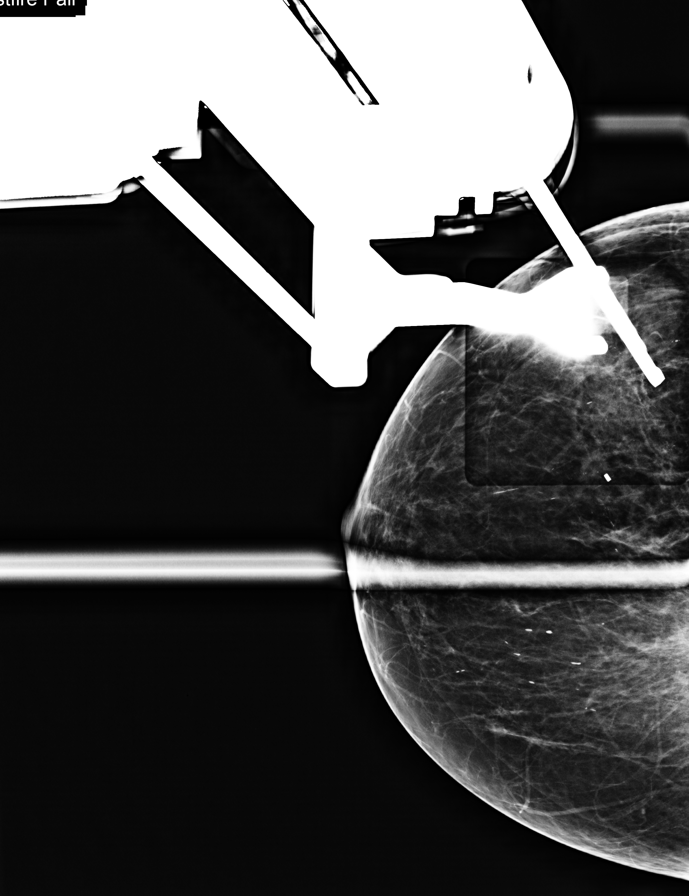

[L CC (2 of 2)]
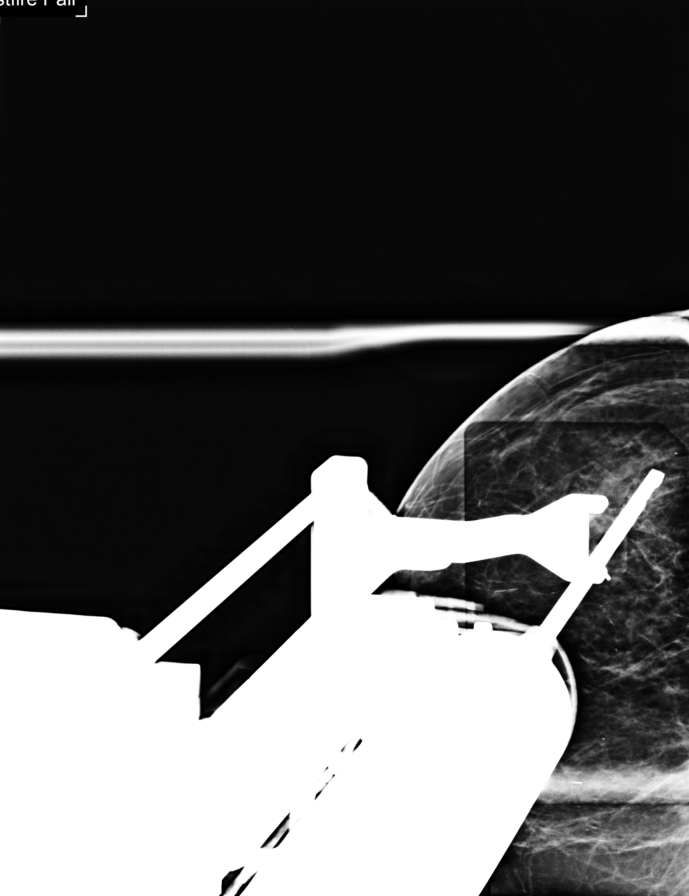

[L CC tomo · 2 of 54 frames shown (1 of 4)]
[frame 18/54]
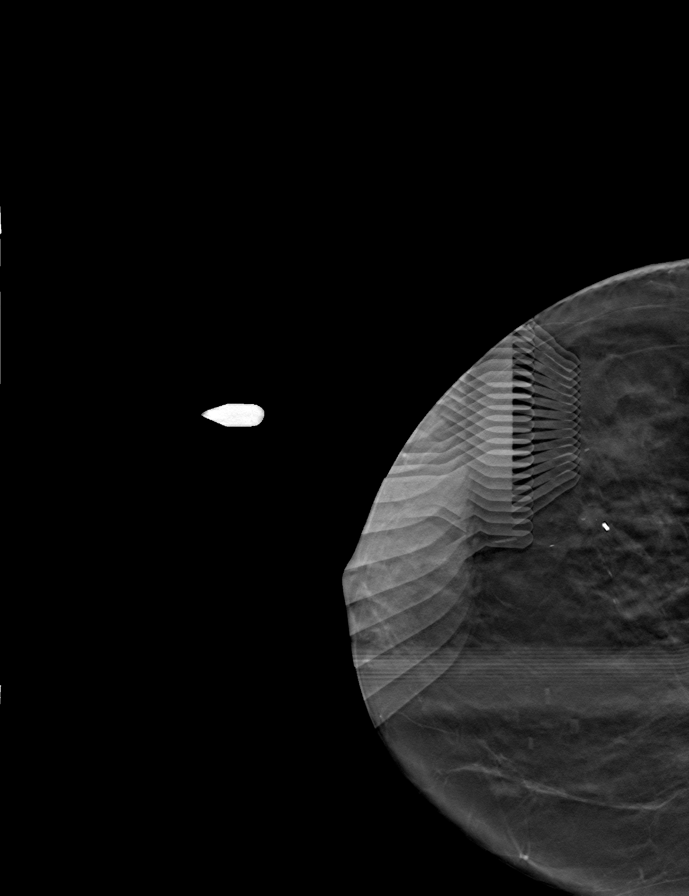
[frame 27/54]
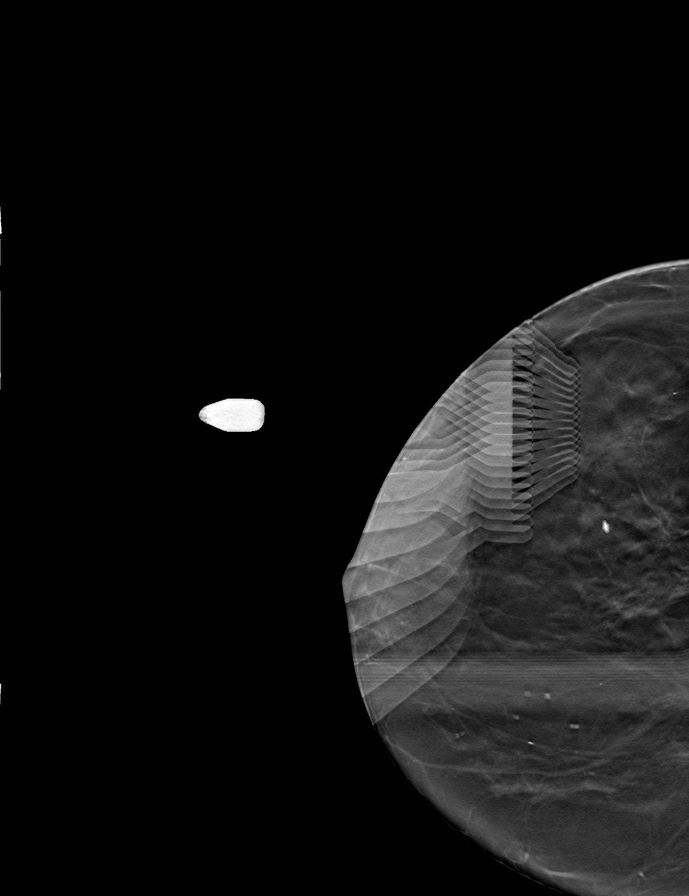

[L CC tomo (2 of 4) · tomo slice 28/55.0]
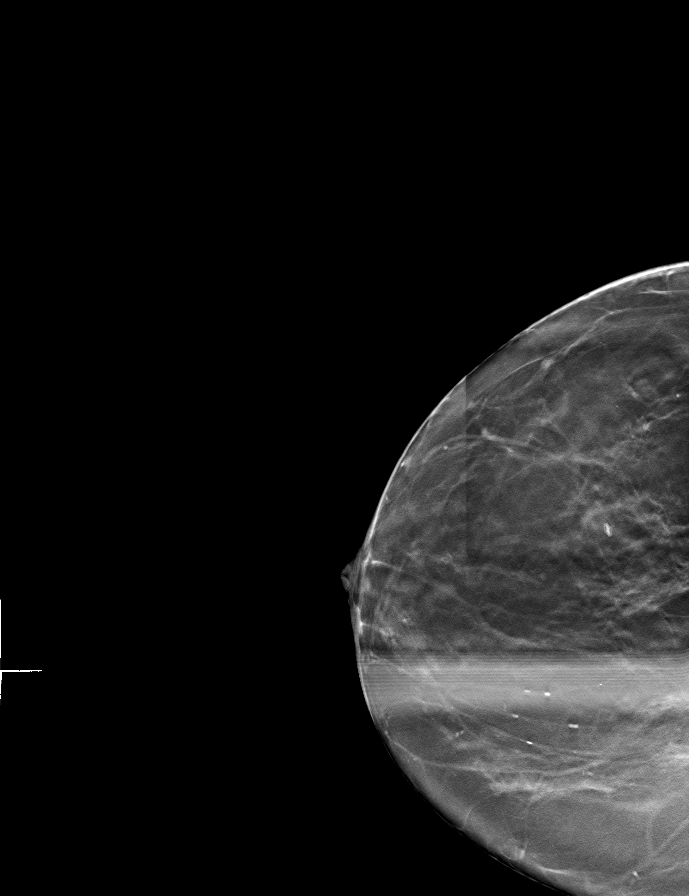

[L CC tomo (3 of 4) · tomo slice 27/54.0]
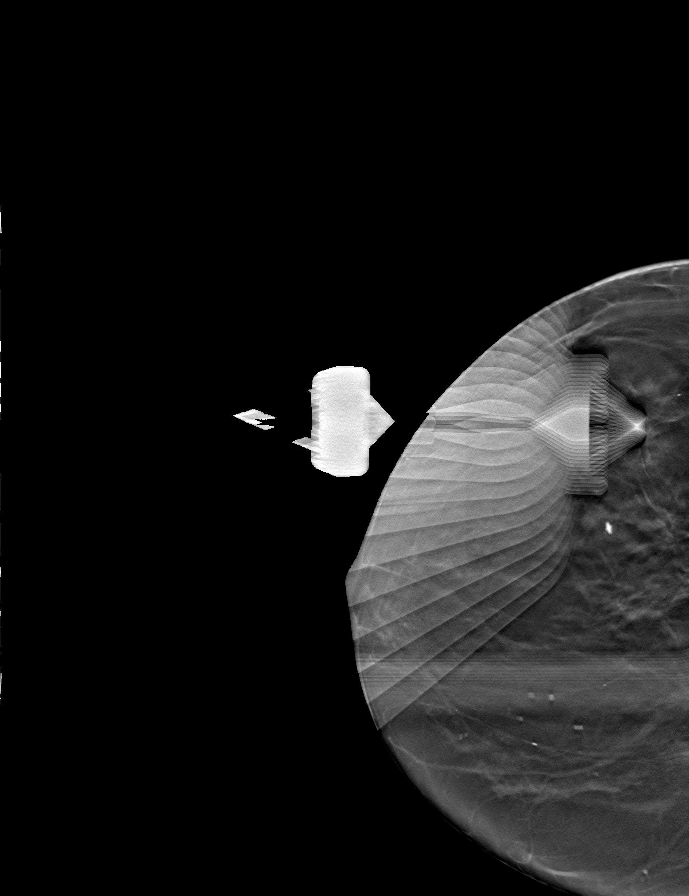

[L CC tomo (4 of 4) · tomo slice 27/54.0]
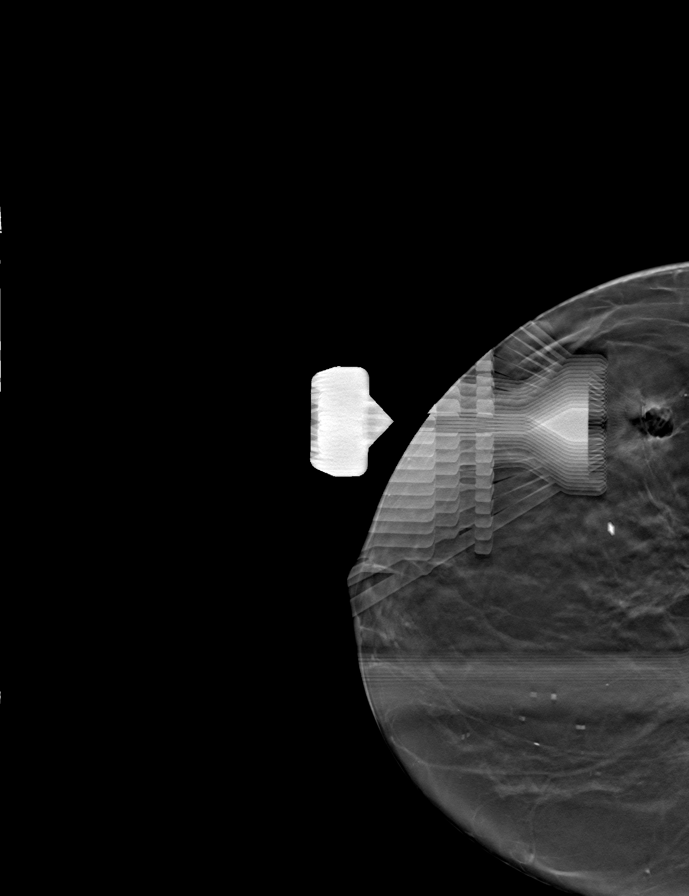

[8 of 23 positions shown; findings below may reference images not displayed]



Using sterile technique and 1% lidocaine as local anesthetic, under
stereotactic guidance, a 9 gauge vacuum assisted device was used to
perform core needle biopsy of calcifications within the medial left
breast using a superior craniocaudal approach. Specimen radiograph
was performed showing representative calcifications within the
specimen. Specimens with calcifications are identified for
pathology.

At the conclusion of the procedure, an X shaped tissue marker clip
marking the medial calcifications biopsy was deployed into the
biopsy cavity.

Following this biopsy, utilizing sterile technique and utilizing 1%
lidocaine as local anesthetic, under stereotactic guidance, a 9
gauge vacuum assisted device was used to perform core needle biopsy
of calcifications within the central left breast using a superior
craniocaudal approach. A specimen radiograph was performed showing
representative calcifications within the specimen. Specimens with
calcifications are identified for pathology.

At the conclusion of this biopsy, a coil shaped tissue marker was
deployed into the biopsy cavity marking the central calcifications.
Follow-up two view mammogram was performed for clip placement.

The procedure performed without immediate complication.
IMPRESSION: Stereotactic-guided biopsy of calcifications located within the
medial left breast and central left breast as discussed above. No
apparent complications.

## 2019-09-15 ENCOUNTER — Other Ambulatory Visit (HOSPITAL_COMMUNITY): Payer: Self-pay | Admitting: Internal Medicine

## 2019-11-14 ENCOUNTER — Encounter (HOSPITAL_COMMUNITY): Payer: Self-pay

## 2019-11-14 NOTE — Progress Notes (Signed)
I placed an introductory phone call to the patient today. I introduced myself and explained my role in the patient's care. I briefly explain what she can expect during her initial visit with Dr. Delton Coombes. Patient reports no known barriers to care. Patient reports that she was unable to maintain follow-ups with her initial cancer diagnosis due to insurance and the pandemic, but does not foresee any barriers at this point. Patient also denies any known family history of cancer with the exception of her personal cancer history. Patient was given the opportunity to ask questions, all were answered to her satisfaction. I provided my contact information and encouraged her to call with any questions or concerns.

## 2019-11-15 ENCOUNTER — Inpatient Hospital Stay (HOSPITAL_COMMUNITY): Payer: Medicare Other | Attending: Hematology | Admitting: Hematology

## 2019-11-15 ENCOUNTER — Other Ambulatory Visit (HOSPITAL_COMMUNITY): Payer: Medicare Other

## 2019-11-15 ENCOUNTER — Encounter (HOSPITAL_COMMUNITY): Payer: Self-pay | Admitting: Hematology

## 2019-11-15 ENCOUNTER — Other Ambulatory Visit: Payer: Self-pay

## 2019-11-15 VITALS — BP 148/78 | HR 96 | Temp 96.8°F | Resp 18 | Ht 59.0 in | Wt 137.0 lb

## 2019-11-15 DIAGNOSIS — Z809 Family history of malignant neoplasm, unspecified: Secondary | ICD-10-CM | POA: Diagnosis not present

## 2019-11-15 DIAGNOSIS — F17210 Nicotine dependence, cigarettes, uncomplicated: Secondary | ICD-10-CM | POA: Diagnosis not present

## 2019-11-15 DIAGNOSIS — Z85828 Personal history of other malignant neoplasm of skin: Secondary | ICD-10-CM | POA: Diagnosis not present

## 2019-11-15 DIAGNOSIS — Z90710 Acquired absence of both cervix and uterus: Secondary | ICD-10-CM | POA: Insufficient documentation

## 2019-11-15 DIAGNOSIS — C50912 Malignant neoplasm of unspecified site of left female breast: Secondary | ICD-10-CM | POA: Diagnosis not present

## 2019-11-15 DIAGNOSIS — M810 Age-related osteoporosis without current pathological fracture: Secondary | ICD-10-CM | POA: Insufficient documentation

## 2019-11-15 DIAGNOSIS — Z170 Estrogen receptor positive status [ER+]: Secondary | ICD-10-CM | POA: Insufficient documentation

## 2019-11-15 DIAGNOSIS — Z9012 Acquired absence of left breast and nipple: Secondary | ICD-10-CM | POA: Insufficient documentation

## 2019-11-15 DIAGNOSIS — C50412 Malignant neoplasm of upper-outer quadrant of left female breast: Secondary | ICD-10-CM

## 2019-11-15 NOTE — Progress Notes (Signed)
[place] [address], [place] [address]   Patient Care Team: Medicine, [facility] Internal as PCP - General (Internal Medicine) [place], Garwin Brothers, RN as Oncology Nurse Navigator (Oncology)  CHIEF COMPLAINTS/PURPOSE OF CONSULTATION:  Recurrent left breast cancer  HISTORY OF PRESENTING ILLNESS:  Virginia Blake 69 y.o. female is here because of recurrence of left breast cancer; she was last seen by Dr. Talbert Cage in 03/12/2016.  Today she reports feeling poorly. She reports that she had her mastectomy in 2017 and didn't go through with anti-estrogen or radiation therapy due to the side-effect profile. Then she felt a knot in September and had a mammogram on 10/11/2019. She denies having any MI's or CVA's. She denies having leg swelling or unexplained weight loss. She takes vitamin D daily. She has a history of basal cell carcinoma on her right posterior upper arm.  Her paternal uncle had some kind of cancer. She lives at home with her ex-husband. She used to work in an office. She continues smoking 1-2 PPD for 51 years.  I reviewed her records extensively and collaborated the history with the patient.  SUMMARY OF ONCOLOGIC HISTORY: Oncology History   No history exists.    In terms of breast cancer risk profile:  She menarched at early age of 3 and went to menopause at age 51 after partial hysterectomy for DUB.  She had 2 pregnancies and 1 was a stillbirth, her first child was born at age 40.  She received birth control pills for approximately 4-5 years.  She was exposed to fertility medications or hormone replacement therapy for several years.  She has denies family history of Breast/GYN/GI cancer.  MEDICAL HISTORY:  Past Medical History:  Diagnosis Date  . Breast cancer ([place])    left mastectomy  2017  . Hx of skin cancer, basal cell    Right posterior upper arm    SURGICAL HISTORY: Past Surgical History:  Procedure Laterality Date  . ABDOMINAL HYSTERECTOMY  1998   . INCONTINENCE SURGERY    . TONSILLECTOMY    . TOTAL MASTECTOMY Left   . TUBAL LIGATION      SOCIAL HISTORY: Social History   Socioeconomic History  . Marital status: Divorced    Spouse name: Not on file  . Number of children: Not on file  . Years of education: Not on file  . Highest education level: Not on file  Occupational History  . Not on file  Tobacco Use  . Smoking status: Current Every Day Smoker    Packs/day: 1.00    Types: Cigarettes  . Smokeless tobacco: Never Used  Vaping Use  . Vaping Use: Never used  Substance and Sexual Activity  . Alcohol use: No  . Drug use: No  . Sexual activity: Never  Other Topics Concern  . Not on file  Social History Narrative  . Not on file   Social Determinants of Health   Financial Resource Strain: Low Risk   . Difficulty of Paying Living Expenses: Not hard at all  Food Insecurity: No Food Insecurity  . Worried About Charity fundraiser in the Last Year: Never true  . Ran Out of Food in the Last Year: Never true  Transportation Needs: No Transportation Needs  . Lack of Transportation (Medical): No  . Lack of Transportation (Non-Medical): No  Physical Activity: Inactive  . Days of Exercise per Week: 0 days  . Minutes of Exercise per Session: 0 min  Stress: Stress  Concern Present  . Feeling of Stress : Very much  Social Connections: Moderately Isolated  . Frequency of Communication with Friends and Family: More than three times a week  . Frequency of Social Gatherings with Friends and Family: Once a week  . Attends Religious Services: More than 4 times per year  . Active Member of Clubs or Organizations: No  . Attends Club or Organization Meetings: Never  . Marital Status: Divorced  Intimate Partner Violence: Not At Risk  . Fear of Current or Ex-Partner: No  . Emotionally Abused: No  . Physically Abused: No  . Sexually Abused: No    FAMILY HISTORY: No family history on file.  ALLERGIES:  is allergic to  ciprofloxacin and doxycycline.  MEDICATIONS:  Current Outpatient Medications  Medication Sig Dispense Refill  . ALPRAZolam (XANAX) 1 MG tablet TK 1 T PO 5 TIMES PER DAY  5  . Ascorbic Acid (VITA-C PO) Take by mouth.    . COPPER PO Take by mouth.    . HYDROcodone-acetaminophen (NORCO) 10-325 MG tablet TAKE 1 TABLET BY MOUTH EVERY 4 HOURS  0  . ipratropium-albuterol (DUONEB) 0.5-2.5 (3) MG/3ML SOLN U 3 ML VIA NEB QID  11  . Multiple Vitamin (THERA) TABS Take by mouth.    . Multiple Vitamins-Minerals (ZINC PO) Take by mouth.    . N-ACETYL CYSTEINE PO Take by mouth.    . NADH-ASCORBIC ACID-SOD BICARB PO Take by mouth.    . VITAMIN A PO Take by mouth.    . VITAMIN E PO Take by mouth.     No current facility-administered medications for this visit.    REVIEW OF SYSTEMS:   Review of Systems  Constitutional: Positive for appetite change (40%) and fatigue (depleted).  Respiratory: Positive for cough (d/t COPD), shortness of breath and wheezing (d/t COPD).   Cardiovascular: Negative for leg swelling.  Genitourinary: Positive for frequency (& urgency).   Musculoskeletal: Positive for myalgias (4/10 overall pain).  Neurological: Positive for dizziness.  All other systems reviewed and are negative.   PHYSICAL EXAMINATION: ECOG PERFORMANCE STATUS: 1 - Symptomatic but completely ambulatory  Vitals:   11/15/19 1345  BP: (!) 148/78  Pulse: 96  Resp: 18  Temp: (!) 96.8 F (36 C)  SpO2: 92%   Filed Weights   11/15/19 1345  Weight: 137 lb (62.1 kg)   Physical Exam Vitals reviewed.  Constitutional:      Appearance: Normal appearance.  Cardiovascular:     Rate and Rhythm: Normal rate and regular rhythm.     Pulses: Normal pulses.     Heart sounds: Normal heart sounds.  Pulmonary:     Effort: Pulmonary effort is normal.     Breath sounds: Normal breath sounds.  Chest:     Breasts:        Right: Normal.        Left: Absent. Mass: 1 cm mobile mass medially and superior to  mastectomy scar.  Abdominal:     Palpations: Abdomen is soft. There is no hepatomegaly, splenomegaly or mass.     Tenderness: There is no abdominal tenderness.     Hernia: No hernia is present.  Musculoskeletal:     Right lower leg: No edema.     Left lower leg: No edema.  Lymphadenopathy:     Upper Body:     Right upper body: No axillary or pectoral adenopathy.     Left upper body: No axillary or pectoral adenopathy.  Neurological:       General: No focal deficit present.     Mental Status: She is alert and oriented to person, place, and time.  Psychiatric:        Mood and Affect: Mood normal.        Behavior: Behavior normal.      LABORATORY DATA:  I have reviewed the data as listed No results found for this or any previous visit (from the past 2160 hour(s)).  RADIOGRAPHIC STUDIES: I have personally reviewed the radiological reports and agreed with the findings in the report. No results found.   ASSESSMENT:  1.  Recurrent left breast cancer: -Patient recently noticed a nodule in the left breast for the past several months. -As it was growing, she had mammogram right breast and ultrasound guided biopsy of the left breast on 11/06/2019 at [facility], [place]. -Pathology on 11/01/2019 consistent with grade 2 invasive ductal carcinoma, ER 95%, PR 0%, Ki-67 10%, HER-2 2+ by IHC, negative by FISH.  2.  Left breast cancer (stage Ia): -Status post left mastectomy and sentinel lymph node biopsy on 02/05/2015 by Dr. Anthony Sar in [place]. -Pathology shows 6 mm, grade 1, positive lymphovascular invasion, single focus, margins negative, ER more than 90%, PR 10%, Ki-67 26%, HER-2 2+, negative by FISH. -She did not take antiestrogen therapy as she was afraid of the side effects.  3.  Social/family history: -Lives at home with ex-husband.  She used to do office work. -Current active smoker 1-2 packs/day for 75 years. -Paternal uncle had cancer, type unknown to the patient.  4.   Osteoporosis: -Last bone density on 10/28/2016 shows T score -2.9. -She was not treated for osteoporosis.    PLAN:  1.  Recurrent left breast cancer, ER positive, PR and HER-2 negative: -Physical exam today shows 1 cm nodule above the mastectomy scar in the medial quadrant of the left breast mastectomy site.  Right breast has no palpable masses.  No palpable adenopathy. -Reviewed pathology report with the patient in detail. -Recommend PET CT scan for further staging. -We will also consider genetic testing. -RTC after PET scan to discuss further plan.  If no evidence of metastatic disease, would recommend excision of the recurrence. -She is also willing to consider antiestrogen therapy.  2.  Osteoporosis: -We will repeat another DEXA scan and vitamin D level.   Derek Jack, MD 11/15/19 2:46 PM  [place] [id number]   I, Milinda Antis, am acting as a scribe for Dr. Sanda Linger.  I, Derek Jack MD, have reviewed the above documentation for accuracy and completeness, and I agree with the above.

## 2019-11-15 NOTE — Patient Instructions (Signed)
[place] at [facility] Discharge Instructions  You were seen and examined today by Dr. Delton Coombes. Dr. Delton Coombes discussed your past medical history and the events that led to you being here today. You were diagnosed with Stage I Breast Cancer in 2017. The lump noted seems to be local, and will likely be able to be removed surgically.  Dr. Delton Coombes has recommended a PET scan, which is a specialized CT scan that illuminates cancer, identifying where cancer is present in your body. Dr. Delton Coombes has also recommended genetic testing, since this is a recurrence.  We have contacted [facility] to get the pathology report from your most recent biopsy, to compare it to your initial diagnosis.  You will return to the clinic   Thank you for choosing [place] at [facility] to provide your oncology and hematology care.  To afford each patient quality time with our provider, please arrive at least 15 minutes before your scheduled appointment time.   If you have a lab appointment with the [place] please come in thru the Main Entrance and check in at the main information desk.  You need to re-schedule your appointment should you arrive 10 or more minutes late.  We strive to give you quality time with our providers, and arriving late affects you and other patients whose appointments are after yours.  Also, if you no show three or more times for appointments you may be dismissed from the clinic at the providers discretion.     Again, thank you for choosing [facility].  Our hope is that these requests will decrease the amount of time that you wait before being seen by our physicians.       _____________________________________________________________  Should you have questions after your visit to [facility], please contact our office at [phone] and follow the prompts.  Our office hours are 8:00 a.m. and 4:30 p.m. Monday -  Friday.  Please note that voicemails left after 4:00 p.m. may not be returned until the following business day.  We are closed weekends and major holidays.  You do have access to a nurse 24-7, just call the main number to the clinic [phone] and do not press any options, hold on the line and a nurse will answer the phone.    For prescription refill requests, have your pharmacy contact our office and allow 72 hours.    Due to Covid, you will need to wear a mask upon entering the hospital. If you do not have a mask, a mask will be given to you at the Main Entrance upon arrival. For doctor visits, patients may have 1 support person age 83 or older with them. For treatment visits, patients can not have anyone with them due to social distancing guidelines and our immunocompromised population.

## 2019-11-20 ENCOUNTER — Other Ambulatory Visit: Payer: Self-pay

## 2019-11-20 ENCOUNTER — Ambulatory Visit (HOSPITAL_COMMUNITY)
Admission: RE | Admit: 2019-11-20 | Discharge: 2019-11-20 | Disposition: A | Discharge status: Home / Self Care | Payer: Medicare Other | Source: Ambulatory Visit | Attending: Hematology | Admitting: Hematology

## 2019-11-20 DIAGNOSIS — Z13820 Encounter for screening for osteoporosis: Secondary | ICD-10-CM | POA: Insufficient documentation

## 2019-11-20 DIAGNOSIS — M810 Age-related osteoporosis without current pathological fracture: Secondary | ICD-10-CM | POA: Insufficient documentation

## 2019-11-20 DIAGNOSIS — Z170 Estrogen receptor positive status [ER+]: Secondary | ICD-10-CM | POA: Diagnosis present

## 2019-11-20 DIAGNOSIS — C50412 Malignant neoplasm of upper-outer quadrant of left female breast: Secondary | ICD-10-CM | POA: Diagnosis present

## 2019-11-20 DIAGNOSIS — Z780 Asymptomatic menopausal state: Secondary | ICD-10-CM | POA: Diagnosis not present

## 2019-11-28 ENCOUNTER — Other Ambulatory Visit: Payer: Self-pay

## 2019-11-28 ENCOUNTER — Encounter (HOSPITAL_COMMUNITY)
Admission: RE | Admit: 2019-11-28 | Discharge: 2019-11-28 | Disposition: A | Discharge status: Home / Self Care | Payer: Medicare Other | Source: Ambulatory Visit | Attending: Hematology | Admitting: Hematology

## 2019-11-28 ENCOUNTER — Encounter (HOSPITAL_COMMUNITY): Payer: Self-pay

## 2019-11-28 DIAGNOSIS — C50412 Malignant neoplasm of upper-outer quadrant of left female breast: Secondary | ICD-10-CM | POA: Insufficient documentation

## 2019-11-28 DIAGNOSIS — Z170 Estrogen receptor positive status [ER+]: Secondary | ICD-10-CM | POA: Diagnosis present

## 2019-11-28 LAB — GLUCOSE, CAPILLARY: Glucose-Capillary: 91 mg/dL (ref 70–99)

## 2019-11-28 MED ORDER — FLUDEOXYGLUCOSE F - 18 (FDG) INJECTION
6.8200 | Freq: Once | INTRAVENOUS | Status: AC
  Administered 2019-11-28: 6.82 via INTRAVENOUS

## 2019-11-30 ENCOUNTER — Encounter (HOSPITAL_COMMUNITY): Payer: Self-pay | Admitting: Genetic Counselor

## 2019-11-30 ENCOUNTER — Inpatient Hospital Stay (HOSPITAL_COMMUNITY): Payer: Medicare Other

## 2019-11-30 ENCOUNTER — Ambulatory Visit (HOSPITAL_COMMUNITY): Payer: Medicare Other | Admitting: Hematology

## 2019-11-30 ENCOUNTER — Other Ambulatory Visit: Payer: Self-pay

## 2019-11-30 ENCOUNTER — Inpatient Hospital Stay (HOSPITAL_COMMUNITY): Payer: Medicare Other | Attending: Hematology | Admitting: Genetic Counselor

## 2019-11-30 DIAGNOSIS — C50412 Malignant neoplasm of upper-outer quadrant of left female breast: Secondary | ICD-10-CM | POA: Diagnosis not present

## 2019-11-30 DIAGNOSIS — Z170 Estrogen receptor positive status [ER+]: Secondary | ICD-10-CM | POA: Diagnosis not present

## 2019-11-30 DIAGNOSIS — M810 Age-related osteoporosis without current pathological fracture: Secondary | ICD-10-CM | POA: Diagnosis present

## 2019-11-30 DIAGNOSIS — Z85828 Personal history of other malignant neoplasm of skin: Secondary | ICD-10-CM | POA: Diagnosis not present

## 2019-11-30 DIAGNOSIS — Z808 Family history of malignant neoplasm of other organs or systems: Secondary | ICD-10-CM | POA: Diagnosis not present

## 2019-11-30 LAB — CBC WITH DIFFERENTIAL/PLATELET
Abs Immature Granulocytes: 0.02 10*3/uL (ref 0.00–0.07)
Basophils Absolute: 0 10*3/uL (ref 0.0–0.1)
Basophils Relative: 1 %
Eosinophils Absolute: 0.1 10*3/uL (ref 0.0–0.5)
Eosinophils Relative: 2 %
HCT: 43.5 % (ref 36.0–46.0)
Hemoglobin: 13.5 g/dL (ref 12.0–15.0)
Immature Granulocytes: 0 %
Lymphocytes Relative: 43 %
Lymphs Abs: 2.5 10*3/uL (ref 0.7–4.0)
MCH: 29.5 pg (ref 26.0–34.0)
MCHC: 31 g/dL (ref 30.0–36.0)
MCV: 95.2 fL (ref 80.0–100.0)
Monocytes Absolute: 0.4 10*3/uL (ref 0.1–1.0)
Monocytes Relative: 6 %
Neutro Abs: 2.8 10*3/uL (ref 1.7–7.7)
Neutrophils Relative %: 48 %
Platelets: 249 10*3/uL (ref 150–400)
RBC: 4.57 MIL/uL (ref 3.87–5.11)
RDW: 12.3 % (ref 11.5–15.5)
WBC: 5.8 10*3/uL (ref 4.0–10.5)
nRBC: 0.7 % — ABNORMAL HIGH (ref 0.0–0.2)

## 2019-11-30 LAB — COMPREHENSIVE METABOLIC PANEL
ALT: 16 U/L (ref 0–44)
AST: 26 U/L (ref 15–41)
Albumin: 4.5 g/dL (ref 3.5–5.0)
Alkaline Phosphatase: 60 U/L (ref 38–126)
Anion gap: 11 (ref 5–15)
BUN: 7 mg/dL — ABNORMAL LOW (ref 8–23)
CO2: 27 mmol/L (ref 22–32)
Calcium: 9.2 mg/dL (ref 8.9–10.3)
Chloride: 98 mmol/L (ref 98–111)
Creatinine, Ser: 0.65 mg/dL (ref 0.44–1.00)
GFR, Estimated: >60 mL/min (ref >60)
Glucose, Bld: 97 mg/dL (ref 70–99)
Potassium: 3.9 mmol/L (ref 3.5–5.1)
Sodium: 136 mmol/L (ref 135–145)
Total Bilirubin: 0.5 mg/dL (ref 0.3–1.2)
Total Protein: 7.5 g/dL (ref 6.5–8.1)

## 2019-11-30 LAB — VITAMIN D 25 HYDROXY (VIT D DEFICIENCY, FRACTURES): Vit D, 25-Hydroxy: 60.53 ng/mL (ref 30–100)

## 2019-11-30 NOTE — Progress Notes (Signed)
REFERRING PROVIDER: Derek Jack, MD [address] [place],  [place] [address]  PRIMARY PROVIDER:  Medicine, [facility]  PRIMARY REASON FOR VISIT:  1. Malignant neoplasm of upper-outer quadrant of left breast in female, estrogen receptor positive ([place])   2. Hx of skin cancer, basal cell   3. Family history of skin cancer      I connected with Ms. Gadsby on 11/30/2019 at 11:00 am EDT by video conference and verified that I am speaking with the correct person using two identifiers.   Patient location: [facility] Provider location: [facility] office  HISTORY OF PRESENT ILLNESS:   Ms. Hisle, a 69 y.o. female, was seen for a [place] cancer genetics consultation at the request of Dr. Delton Coombes due to a personal and family history of cancer.  Ms. Fenn presents to clinic today to discuss the possibility of a hereditary predisposition to cancer, genetic testing, and to further clarify her future cancer risks, as well as potential cancer risks for family members.   In December of 2016, at the age of 34, Ms. Salas was diagnosed with invasive ductal carcinoma of the left breast. The treatment plan included mastectomy. She was recently diagnosed with recurrent left breast cancer.  RISK FACTORS:  Menarche was at age 19.  First live birth at age 89.  OCP use for approximately 4-5 years.  Ovaries intact: yes.  Hysterectomy: yes.  Menopausal status: postmenopausal.  HRT use: several years, stopped around 2003 or 2004. Colonoscopy: yes, no polyps per patient. Mammogram within the last year: yes. Any excessive radiation exposure in the past: no  Past Medical History:  Diagnosis Date  . Breast cancer ([place])    left mastectomy  2017  . Family history of skin cancer   . Hx of skin cancer, basal cell    Right posterior upper arm    Past Surgical History:  Procedure Laterality Date  . ABDOMINAL HYSTERECTOMY  1998  . INCONTINENCE SURGERY    . TONSILLECTOMY    . TOTAL  MASTECTOMY Left   . TUBAL LIGATION      Social History   Socioeconomic History  . Marital status: Divorced    Spouse name: Not on file  . Number of children: Not on file  . Years of education: Not on file  . Highest education level: Not on file  Occupational History  . Not on file  Tobacco Use  . Smoking status: Current Every Day Smoker    Packs/day: 1.00    Types: Cigarettes  . Smokeless tobacco: Never Used  Vaping Use  . Vaping Use: Never used  Substance and Sexual Activity  . Alcohol use: No  . Drug use: No  . Sexual activity: Never  Other Topics Concern  . Not on file  Social History Narrative  . Not on file   Social Determinants of Health   Financial Resource Strain: Low Risk   . Difficulty of Paying Living Expenses: Not hard at all  Food Insecurity: No Food Insecurity  . Worried About Charity fundraiser in the Last Year: Never true  . Ran Out of Food in the Last Year: Never true  Transportation Needs: No Transportation Needs  . Lack of Transportation (Medical): No  . Lack of Transportation (Non-Medical): No  Physical Activity: Inactive  . Days of Exercise per Week: 0 days  . Minutes of Exercise per Session: 0 min  Stress: Stress Concern Present  . Feeling of Stress : Very much  Social  Connections: Moderately Isolated  . Frequency of Communication with Friends and Family: More than three times a week  . Frequency of Social Gatherings with Friends and Family: Once a week  . Attends Religious Services: More than 4 times per year  . Active Member of Clubs or Organizations: No  . Attends Banker Meetings: Never  . Marital Status: Divorced     FAMILY HISTORY:  We obtained a detailed, 4-generation family history.  Significant diagnoses are listed below: Family History  Problem Relation Age of Onset  . Skin cancer Brother   . Skin cancer Paternal Uncle        nose  . Heart attack Maternal Grandmother   . Heart attack Maternal Grandfather   .  Stroke Paternal Grandfather   . Skin cancer Brother   . Cancer Paternal Uncle        unknown type   Ms. Mazzarella has one daughter who is 28. Ms. Cotta has two brothers who have both had skin cancer, and she had a sister who died at the age of 49.  Ms. Warsame mother died at the age of 71 and did not have cancer. She had three maternal aunts and two maternal uncles. Her maternal grandparents both died in their 49s from heart attacks. There are no known diagnoses of cancer on the maternal side of the family.  Ms. Mohs father died at the age of 29 and did not have cancer. She had nine paternal aunts and uncles. One uncle had skin cancer on his nose. Another uncle died from an unknown type of cancer when he was 32. Her paternal grandmother died at age 20, and her paternal grandfather died at age 50 from a stroke. There are no other known diagnoses of cancer on the paternal side of the family.   Ms. Vaquera is unaware of previous family history of genetic testing for hereditary cancer risks. Patient's maternal and paternal ancestors are of [place] and [place] descent. There is no reported Ashkenazi Jewish ancestry. There is no known consanguinity.  GENETIC COUNSELING ASSESSMENT: Ms. Older is a 69 y.o. female with a personal history of breast cancer which is not highly suggestive of a hereditary cancer syndrome. We, therefore, discussed and recommended the following at today's visit.   DISCUSSION: We discussed that approximately 5-10% of breast cancer is hereditary, with most cases associated with the BRCA1 and BRCA2 genes. There are other genes that can be associated with hereditary breast cancer syndromes. These include ATM, CHEK2, PALB2, etc. We discussed that testing is beneficial for several reasons, including knowing about other cancer risks, identifying potential screening and risk-reduction options that may be appropriate, and to understand if other family members could be at risk for cancer and allow  them to undergo genetic testing.  We reviewed the characteristics, features and inheritance patterns of hereditary cancer syndromes. We discussed with Ms. Deland that the personal and family history does not meet insurance or NCCN criteria for genetic testing and, therefore, is not highly consistent with a familial hereditary cancer syndrome.  We feel she is at low risk to harbor a gene mutation associated with such a condition. Thus, we did not recommend any genetic testing, at this time, and recommended Ms. Denio continue to follow the cancer screening guidelines given by her primary healthcare provider.  PLAN: Because she does not meet medical criteria for genetic testing at this time, Ms. Borenstein did not proceed with genetic testing at today's visit. We remain available to coordinate  genetic testing at any time in the future. We, therefore, recommend Ms. Alsteen continue to follow the cancer screening guidelines given by her primary healthcare provider.  Ms. Hoggard questions were answered to her satisfaction today. Our contact information was provided should additional questions or concerns arise. Thank you for the referral and allowing [place] to share in the care of your patient.   Clint Guy, [place], [facility] Licensed, Certified Dispensing [email or web address] Phone: [phone]  The patient was seen for a total of 30 minutes in face-to-face genetic counseling.  This patient was discussed with Drs. Magrinat, Lindi Adie and/or Burr Medico who agrees with the above.    _______________________________________________________________________ For Office Staff:  Number of people involved in session: 1 Was an Intern/ student involved with case: no

## 2019-12-01 LAB — CANCER ANTIGEN 27.29: CA 27.29: 13.6 U/mL (ref 0.0–38.6)

## 2019-12-01 LAB — CANCER ANTIGEN 15-3: CA 15-3: 15.1 U/mL (ref 0.0–25.0)

## 2019-12-11 ENCOUNTER — Encounter (HOSPITAL_COMMUNITY): Payer: Medicare Other

## 2019-12-18 ENCOUNTER — Ambulatory Visit (HOSPITAL_COMMUNITY): Payer: Medicare Other | Admitting: Hematology

## 2020-01-08 ENCOUNTER — Ambulatory Visit (HOSPITAL_COMMUNITY)
Admission: RE | Admit: 2020-01-08 | Discharge: 2020-01-08 | Disposition: A | Discharge status: Home / Self Care | Payer: Medicare Other | Source: Ambulatory Visit | Attending: Hematology | Admitting: Hematology

## 2020-01-08 ENCOUNTER — Other Ambulatory Visit: Payer: Self-pay

## 2020-01-08 DIAGNOSIS — C50412 Malignant neoplasm of upper-outer quadrant of left female breast: Secondary | ICD-10-CM | POA: Diagnosis present

## 2020-01-08 DIAGNOSIS — Z170 Estrogen receptor positive status [ER+]: Secondary | ICD-10-CM | POA: Insufficient documentation

## 2020-01-08 DIAGNOSIS — Z9012 Acquired absence of left breast and nipple: Secondary | ICD-10-CM | POA: Diagnosis not present

## 2020-01-08 LAB — GLUCOSE, CAPILLARY: Glucose-Capillary: 95 mg/dL (ref 70–99)

## 2020-01-08 MED ORDER — FLUDEOXYGLUCOSE F - 18 (FDG) INJECTION
6.8000 | Freq: Once | INTRAVENOUS | Status: AC | PRN
  Administered 2020-01-08: 6.8 via INTRAVENOUS

## 2020-01-18 ENCOUNTER — Inpatient Hospital Stay (HOSPITAL_COMMUNITY): Payer: Medicare Other | Attending: Hematology | Admitting: Hematology

## 2020-01-18 VITALS — BP 138/57 | HR 86 | Temp 98.4°F | Resp 18 | Wt 133.0 lb

## 2020-01-18 DIAGNOSIS — Z170 Estrogen receptor positive status [ER+]: Secondary | ICD-10-CM | POA: Diagnosis not present

## 2020-01-18 DIAGNOSIS — Z809 Family history of malignant neoplasm, unspecified: Secondary | ICD-10-CM | POA: Diagnosis not present

## 2020-01-18 DIAGNOSIS — R918 Other nonspecific abnormal finding of lung field: Secondary | ICD-10-CM | POA: Diagnosis not present

## 2020-01-18 DIAGNOSIS — Z808 Family history of malignant neoplasm of other organs or systems: Secondary | ICD-10-CM | POA: Diagnosis not present

## 2020-01-18 DIAGNOSIS — R059 Cough, unspecified: Secondary | ICD-10-CM | POA: Diagnosis not present

## 2020-01-18 DIAGNOSIS — R0602 Shortness of breath: Secondary | ICD-10-CM | POA: Diagnosis not present

## 2020-01-18 DIAGNOSIS — C50412 Malignant neoplasm of upper-outer quadrant of left female breast: Secondary | ICD-10-CM | POA: Diagnosis not present

## 2020-01-18 DIAGNOSIS — Z85828 Personal history of other malignant neoplasm of skin: Secondary | ICD-10-CM | POA: Diagnosis not present

## 2020-01-18 DIAGNOSIS — M810 Age-related osteoporosis without current pathological fracture: Secondary | ICD-10-CM | POA: Insufficient documentation

## 2020-01-18 DIAGNOSIS — Z9012 Acquired absence of left breast and nipple: Secondary | ICD-10-CM | POA: Diagnosis not present

## 2020-01-18 DIAGNOSIS — F17210 Nicotine dependence, cigarettes, uncomplicated: Secondary | ICD-10-CM | POA: Diagnosis not present

## 2020-01-18 DIAGNOSIS — C50812 Malignant neoplasm of overlapping sites of left female breast: Secondary | ICD-10-CM | POA: Diagnosis present

## 2020-01-18 NOTE — Progress Notes (Signed)
[place], [place] [address]   CLINIC:  Medical Oncology/Hematology  PCP:  Medicine, [facility] [address] / [place] [address] [phone]   REASON FOR VISIT:  Follow-up for recurrent left breast cancer  PRIOR THERAPY: Left mastectomy on 02/05/2015  NGS Results: ER/PR positive, HER-2 negative, Ki-67 26%  CURRENT THERAPY: Under work-up  BRIEF ONCOLOGIC HISTORY:  Oncology History   No history exists.    CANCER STAGING: Cancer Staging Malignant neoplasm of left female breast [facility]) Staging form: Breast, AJCC 7th Edition - Clinical stage from 02/05/2015: Stage IA (T1b, N0, M0) - Signed by Twana First, MD on 03/12/2016   INTERVAL HISTORY:  [place]ia Blake, a 69 y.o. female, returns for routine follow-up of her recurrent left breast cancer. Lavonn was last seen on 11/15/2019.   Today she is accompanied by her daughter and she reports feeling poorly. She continues having a cough and SOB with exertion. She also complains of vague bone pain most prominent in her back. She also reports having occasional dizziness. She does not follow a dentist regularly and has some teeth missing on the right side of her mouth.   REVIEW OF SYSTEMS:  Review of Systems  Constitutional: Positive for appetite change (25%) and fatigue (depleted).  Respiratory: Positive for cough and shortness of breath.   Musculoskeletal: Positive for back pain (3/10 back pain).  Neurological: Positive for dizziness.  All other systems reviewed and are negative.   PAST MEDICAL/SURGICAL HISTORY:  Past Medical History:  Diagnosis Date  . Breast cancer ([place])    left mastectomy  2017  . Family history of skin cancer   . Hx of skin cancer, basal cell    Right posterior upper arm   Past Surgical History:  Procedure Laterality Date  . ABDOMINAL HYSTERECTOMY  1998  . INCONTINENCE SURGERY    . TONSILLECTOMY    . TOTAL MASTECTOMY Left   . TUBAL LIGATION      SOCIAL  HISTORY:  Social History   Socioeconomic History  . Marital status: Divorced    Spouse name: Not on file  . Number of children: Not on file  . Years of education: Not on file  . Highest education level: Not on file  Occupational History  . Not on file  Tobacco Use  . Smoking status: Current Every Day Smoker    Packs/day: 1.00    Types: Cigarettes  . Smokeless tobacco: Never Used  Vaping Use  . Vaping Use: Never used  Substance and Sexual Activity  . Alcohol use: No  . Drug use: No  . Sexual activity: Never  Other Topics Concern  . Not on file  Social History Narrative  . Not on file   Social Determinants of Health   Financial Resource Strain: Low Risk   . Difficulty of Paying Living Expenses: Not hard at all  Food Insecurity: No Food Insecurity  . Worried About Charity fundraiser in the Last Year: Never true  . Ran Out of Food in the Last Year: Never true  Transportation Needs: No Transportation Needs  . Lack of Transportation (Medical): No  . Lack of Transportation (Non-Medical): No  Physical Activity: Inactive  . Days of Exercise per Week: 0 days  . Minutes of Exercise per Session: 0 min  Stress: Stress Concern Present  . Feeling of Stress : Very much  Social Connections: Moderately Isolated  . Frequency of Communication with Friends and Family: More than  three times a week  . Frequency of Social Gatherings with Friends and Family: Once a week  . Attends Religious Services: More than 4 times per year  . Active Member of Clubs or Organizations: No  . Attends Archivist Meetings: Never  . Marital Status: Divorced  Human resources officer Violence: Not At Risk  . Fear of Current or Ex-Partner: No  . Emotionally Abused: No  . Physically Abused: No  . Sexually Abused: No    FAMILY HISTORY:  Family History  Problem Relation Age of Onset  . Skin cancer Brother   . Skin cancer Paternal Uncle        nose  . Heart attack Maternal Grandmother   . Heart attack  Maternal Grandfather   . Stroke Paternal Grandfather   . Skin cancer Brother   . Cancer Paternal Uncle        unknown type    CURRENT MEDICATIONS:  Current Outpatient Medications  Medication Sig Dispense Refill  . ALPRAZolam (XANAX) 1 MG tablet TK 1 T PO 5 TIMES PER DAY  5  . amoxicillin-clavulanate (AUGMENTIN) 875-125 MG tablet SMARTSIG:1 Tablet(s) By Mouth Every 12 Hours    . Ascorbic Acid (VITA-C PO) Take by mouth.    . COPPER PO Take by mouth.    . Multiple Vitamin (THERA) TABS Take by mouth.    . Multiple Vitamins-Minerals (ZINC PO) Take by mouth.    . N-ACETYL CYSTEINE PO Take by mouth.    Marland Kitchen NADH-ASCORBIC ACID-SOD BICARB PO Take by mouth.    Marland Kitchen VITAMIN A PO Take by mouth.    Marland Kitchen VITAMIN E PO Take by mouth.    Marland Kitchen albuterol (VENTOLIN HFA) 108 (90 Base) MCG/ACT inhaler SMARTSIG:2 Puff(s) By Mouth Every 4 Hours (Patient not taking: Reported on 01/18/2020)    . guaiFENesin (ROBITUSSIN) 100 MG/5ML liquid Take 10 mLs by mouth every 4 (four) hours as needed. (Patient not taking: Reported on 01/18/2020)    . HYDROcodone-acetaminophen (NORCO) 10-325 MG tablet TAKE 1 TABLET BY MOUTH EVERY 4 HOURS (Patient not taking: Reported on 01/18/2020)  0  . ipratropium-albuterol (DUONEB) 0.5-2.5 (3) MG/3ML SOLN U 3 ML VIA NEB QID (Patient not taking: Reported on 01/18/2020)  11  . methylPREDNISolone (MEDROL DOSEPAK) 4 MG TBPK tablet Take by mouth as directed. (Patient not taking: Reported on 01/18/2020)     No current facility-administered medications for this visit.    ALLERGIES:  Allergies  Allergen Reactions  . Ciprofloxacin Anxiety  . Doxycycline     PHYSICAL EXAM:  Performance status (ECOG): 1 - Symptomatic but completely ambulatory  Vitals:   01/18/20 1627 01/18/20 1629  BP: (!) 153/114 (!) 138/57  Pulse: 86   Resp: 18   Temp: 98.4 F (36.9 C)   SpO2: 92%    Wt Readings from Last 3 Encounters:  01/18/20 133 lb (60.3 kg)  11/15/19 137 lb (62.1 kg)  11/10/16 145 lb 8 oz (66 kg)    Physical Exam Vitals reviewed.  Constitutional:      Appearance: Normal appearance.  Chest:  Breasts:     Left: Mass (1 cm hard mobile mass in medial site of mastectomy site) and skin change (mastectomy site well healed) present. No swelling or tenderness.    Neurological:     General: No focal deficit present.     Mental Status: She is alert and oriented to person, place, and time.  Psychiatric:        Mood and Affect: Mood normal.  Behavior: Behavior normal.      LABORATORY DATA:  I have reviewed the labs as listed.  CBC Latest Ref Rng & Units 11/30/2019 11/10/2016 10/28/2016  WBC 4.0 - 10.5 K/uL 5.8 7.6 7.0  Hemoglobin 12.0 - 15.0 g/dL 13.5 13.4 13.4  Hematocrit 36.0 - 46.0 % 43.5 41.4 40.4  Platelets 150 - 400 K/uL 249 255 234   CMP Latest Ref Rng & Units 11/30/2019 11/10/2016 10/28/2016  Glucose 70 - 99 mg/dL 97 87 107(H)  BUN 8 - 23 mg/dL 7(L) 10 37(H)  Creatinine 0.44 - 1.00 mg/dL 0.65 0.62 2.09(H)  Sodium 135 - 145 mmol/L 136 140 136  Potassium 3.5 - 5.1 mmol/L 3.9 4.4 4.7  Chloride 98 - 111 mmol/L 98 101 105  CO2 22 - 32 mmol/L $RemoveB'27 31 22  'KgVVlFDi$ Calcium 8.9 - 10.3 mg/dL 9.2 9.7 6.8(L)  Total Protein 6.5 - 8.1 g/dL 7.5 7.3 7.2  Total Bilirubin 0.3 - 1.2 mg/dL 0.5 0.5 0.6  Alkaline Phos 38 - 126 U/L 60 71 169(H)  AST 15 - 41 U/L $Remo'26 23 22  'QYpsr$ ALT 0 - 44 U/L $Remo'16 15 18   'khRQX$ Lab Results  Component Value Date   CA2729 13.6 11/30/2019   Lab Results  Component Value Date   VD25OH 60.53 11/30/2019    DIAGNOSTIC IMAGING:  I have independently reviewed the scans and discussed with the patient. NM PET Image Initial (PI) Skull Base To Thigh  Result Date: 01/08/2020 CLINICAL DATA:  Subsequent treatment strategy for breast cancer. Recent nodule in the left breast, growing. Status post left mastectomy 2017. EXAM: NUCLEAR MEDICINE PET SKULL BASE TO THIGH TECHNIQUE: 6.8 mCi F-18 FDG was injected intravenously. Full-ring PET imaging was performed from the skull base to thigh  after the radiotracer. CT data was obtained and used for attenuation correction and anatomic localization. Fasting blood glucose: 95 mg/dl COMPARISON:  CT chest 04/30/2015 FINDINGS: Mediastinal blood pool activity: SUV max 3.2 Liver activity: SUV max NA NECK: No hypermetabolic lymph nodes in the neck. Incidental CT findings: none CHEST: Tiny 6 mm nodular soft tissue density identified in the left paramidline anterior chest wall just superficial to the anterior left fourth rib end. Localization clip is identified immediately medial to the nodule. This nodule shows only low level FDG uptake with SUV max = 1.8. No overtly hypermetabolic lesion identified in the left mastectomy bed. No hypermetabolic mediastinal or hilar nodes. No suspicious pulmonary nodules on the CT scan. Incidental CT findings: Non breath hold CT imaging today suggests tiny tree-in-bud nodular opacities in both upper lungs, not well demonstrated due to breathing motion. There is abdominal aortic atherosclerosis without aneurysm. ABDOMEN/PELVIS: No abnormal hypermetabolic activity within the liver, pancreas, adrenal glands, or spleen. No hypermetabolic lymph nodes in the abdomen or pelvis. Incidental CT findings: Probable layering sludge in the gallbladder. Tiny gas bubble identified in the bladder lumen. Abdominal aortic atherosclerosis noted without aneurysm. SKELETON: No focal hypermetabolic activity to suggest skeletal metastasis. Incidental CT findings: No worrisome lytic or sclerotic osseous abnormality. IMPRESSION: Tiny soft tissue nodule in the medial aspect of the left mastectomy bed shows low level FDG accumulation, compatible with the patient's biopsy-proven recurrence. No evidence for unexpected or suspicious hypermetabolic disease elsewhere in the neck, chest, abdomen, or pelvis to suggest distant metastases. Tiny apparent nodules in both upper lungs with tree-in-bud configuration. This is not well evaluated on today's non breath hold  study but raises the question of atypical infection. Dedicated CT chest without contrast recommended to further evaluate.  Tiny gas bubble identified in the urinary bladder. Presumably secondary to recent instrumentation. In the absence of recent instrumentation, bladder infection would be a consideration. Aortic Atherosclerois (ICD10-170.0) Electronically Signed   By: Misty Stanley M.D.   On: 01/08/2020 14:48     ASSESSMENT:  1.  Recurrent left breast cancer: -Patient recently noticed a nodule in the left breast for the past several months. -As it was growing, she had mammogram right breast and ultrasound guided biopsy of the left breast on 11/06/2019 at [facility]: [place] [address], [place]. -Pathology on 11/01/2019 consistent with grade 2 invasive ductal carcinoma, ER 95%, PR 0%, Ki-67 10%, HER-2 2+ by IHC, negative by FISH. -PET scan on 01/08/2020 with tiny soft tissue nodule in the medial aspect of the left mastectomy bed with low-level FDG accumulation compatible with biopsy-proven malignancy with no evidence of metastatic disease.  Tiny lung nodules nonspecific.  2.  Left breast cancer (stage Ia): -Status post left mastectomy and sentinel lymph node biopsy on 02/05/2015 by Dr. Anthony Sar in [place]. -Pathology shows 6 mm, grade 1, positive lymphovascular invasion, single focus, margins negative, ER more than 90%, PR 10%, Ki-67 26%, HER-2 2+, negative by FISH. -She did not take antiestrogen therapy as she was afraid of the side effects.  3.  Social/family history: -Lives at home with ex-husband.  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 -Paternal uncle had cancer, type unknown to the patient.  4.  Osteoporosis: -Last bone density on 10/28/2016 shows T score -2.9. -She was not treated for osteoporosis. -DEXA scan on 11/20/2019 with T score -3.2.   PLAN:  1.  Recurrent left breast cancer, ER positive, PR and HER-2 negative: -Physical examination shows 1 cm nodule freely  mobile in the medial quadrant of the left breast mastectomy site. -We discussed results of the PET CT scan with the patient in detail. -Recommend resection of the nodule.  Will refer to general surgery. -RTC 4 weeks after surgery. -We will start her on tamoxifen/AI after surgery.  2.  Osteoporosis: -We reviewed results of the bone density test. -Vitamin D level was normal.  She was encouraged to take calcium supplements. -Talk to her about initiating Prolia.  Recommended dental evaluation.   Orders placed this encounter:  No orders of the defined types were placed in this encounter.    Derek Jack, MD [facility] [phone]   I, Milinda Antis, am acting as a scribe for Dr. Sanda Linger.  I, Derek Jack MD, have reviewed the above documentation for accuracy and completeness, and I agree with the above.

## 2020-01-18 NOTE — Patient Instructions (Signed)
[facility] at [facility] Discharge Instructions  You were seen today by Dr. Ellin Saba. He went over your recent results. You will be referred to one of local general surgeons, Dr. Lovell Sheehan or Dr. Henreitta Leber, for a lumpectomy procedure over your prior mastectomy site. Dr. Ellin Saba will see you back 4 weeks after the surgery for follow up.   Thank you for choosing [facility] at [facility] to provide your oncology and hematology care.  To afford each patient quality time with our provider, please arrive at least 15 minutes before your scheduled appointment time.   If you have a lab appointment with the Cancer Center please come in thru the Main Entrance and check in at the main information desk  You need to re-schedule your appointment should you arrive 10 or more minutes late.  We strive to give you quality time with our providers, and arriving late affects you and other patients whose appointments are after yours.  Also, if you no show three or more times for appointments you may be dismissed from the clinic at the providers discretion.     Again, thank you for choosing [facility].  Our hope is that these requests will decrease the amount of time that you wait before being seen by our physicians.       _____________________________________________________________  Should you have questions after your visit to [facility], please contact our office at [phone] between the hours of 8:00 a.m. and 4:30 p.m.  Voicemails left after 4:00 p.m. will not be returned until the following business day.  For prescription refill requests, have your pharmacy contact our office and allow 72 hours.    Cancer Center Support Programs:   > Cancer Support Group  2nd Tuesday of the month 1pm-2pm, Journey Room

## 2020-01-25 ENCOUNTER — Ambulatory Visit (HOSPITAL_COMMUNITY): Payer: Medicare Other | Admitting: Hematology

## 2020-02-01 ENCOUNTER — Encounter: Payer: Self-pay | Admitting: General Surgery

## 2020-02-01 ENCOUNTER — Ambulatory Visit: Payer: Medicare Other | Admitting: General Surgery

## 2020-02-01 ENCOUNTER — Encounter (INDEPENDENT_AMBULATORY_CARE_PROVIDER_SITE_OTHER): Payer: Self-pay

## 2020-02-01 ENCOUNTER — Other Ambulatory Visit: Payer: Self-pay

## 2020-02-01 VITALS — BP 136/80 | HR 75 | Temp 97.8°F | Resp 16 | Ht 59.0 in | Wt 134.0 lb

## 2020-02-01 DIAGNOSIS — C50912 Malignant neoplasm of unspecified site of left female breast: Secondary | ICD-10-CM

## 2020-02-01 NOTE — Progress Notes (Signed)
Virginia Blake; [id number]; Sep 06, 1950   HPI Patient is a 70 year old white female status post a left modified radical mastectomy in 2017 by Dr. Anthony Sar who is referred to my care by Dr. Delton Coombes of oncology for a recurrent malignancy along the medial aspect of the surgical scar. This is biopsy-proven to be positive. She underwent a PET scan which shows no other metastatic lesions. She has been referred to my care for a wide excision of the recurrence. Patient states that has been present for approximately 1 year and has been increasing in size. Past Medical History:  Diagnosis Date  . Breast cancer ([name])    left mastectomy  2017  . Family history of skin cancer   . Hx of skin cancer, basal cell    Right posterior upper arm    Past Surgical History:  Procedure Laterality Date  . ABDOMINAL HYSTERECTOMY  1998  . INCONTINENCE SURGERY    . TONSILLECTOMY    . TOTAL MASTECTOMY Left   . TUBAL LIGATION      Family History  Problem Relation Age of Onset  . Skin cancer Brother   . Skin cancer Paternal Uncle        nose  . Heart attack Maternal Grandmother   . Heart attack Maternal Grandfather   . Stroke Paternal Grandfather   . Skin cancer Brother   . Cancer Paternal Uncle        unknown type    Current Outpatient Medications on File Prior to Visit  Medication Sig Dispense Refill  . ALPRAZolam (XANAX) 1 MG tablet TK 1 T PO 5 TIMES PER DAY  5  . amoxicillin-clavulanate (AUGMENTIN) 875-125 MG tablet SMARTSIG:1 Tablet(s) By Mouth Every 12 Hours    . Ascorbic Acid (VITA-C PO) Take by mouth.    . COPPER PO Take by mouth.    . Multiple Vitamin (THERA) TABS Take by mouth.    . Multiple Vitamins-Minerals (ZINC PO) Take by mouth.    . N-ACETYL CYSTEINE PO Take by mouth.    Marland Kitchen NADH-ASCORBIC ACID-SOD BICARB PO Take by mouth.    Marland Kitchen VITAMIN A PO Take by mouth.    Marland Kitchen VITAMIN E PO Take by mouth.    Marland Kitchen albuterol (VENTOLIN HFA) 108 (90 Base) MCG/ACT inhaler SMARTSIG:2 Puff(s) By Mouth Every 4 Hours  (Patient not taking: No sig reported)    . guaiFENesin (ROBITUSSIN) 100 MG/5ML liquid Take 10 mLs by mouth every 4 (four) hours as needed. (Patient not taking: No sig reported)    . HYDROcodone-acetaminophen (NORCO) 10-325 MG tablet TAKE 1 TABLET BY MOUTH EVERY 4 HOURS (Patient not taking: No sig reported)  0  . ipratropium-albuterol (DUONEB) 0.5-2.5 (3) MG/3ML SOLN U 3 ML VIA NEB QID (Patient not taking: No sig reported)  11  . methylPREDNISolone (MEDROL DOSEPAK) 4 MG TBPK tablet Take by mouth as directed. (Patient not taking: No sig reported)     No current facility-administered medications on file prior to visit.    Allergies  Allergen Reactions  . Ciprofloxacin Anxiety  . Doxycycline     Social History   Substance and Sexual Activity  Alcohol Use No    Social History   Tobacco Use  Smoking Status Current Every Day Smoker  . Packs/day: 1.00  . Types: Cigarettes  Smokeless Tobacco Never Used    Review of Systems  Constitutional: Positive for malaise/fatigue.  HENT: Negative.   Eyes: Positive for blurred vision and double vision.  Respiratory: Positive for cough, shortness of breath  and wheezing.   Cardiovascular: Negative.   Gastrointestinal: Positive for heartburn and nausea.  Genitourinary: Positive for dysuria, frequency and urgency.  Musculoskeletal: Positive for back pain, joint pain and neck pain.  Skin: Negative.   Neurological: Negative.   Endo/Heme/Allergies: Negative.   Psychiatric/Behavioral: Negative.     Objective   Vitals:   02/01/20 0946  BP: 136/80  Pulse: 75  Resp: 16  Temp: 97.8 F (36.6 C)  SpO2: 93%    Physical Exam Vitals reviewed.  Constitutional:      Appearance: Normal appearance. She is normal weight. She is not ill-appearing.  HENT:     Head: Normocephalic and atraumatic.  Cardiovascular:     Rate and Rhythm: Normal rate and regular rhythm.     Heart sounds: Normal heart sounds. No murmur heard. No friction rub. No gallop.    Pulmonary:     Effort: Pulmonary effort is normal. No respiratory distress.     Breath sounds: Normal breath sounds. No stridor. No wheezing, rhonchi or rales.  Skin:    General: Skin is warm and dry.  Neurological:     Mental Status: She is alert and oriented to person, place, and time.   Breast: Left mastectomy surgical scar well-healed with a 1.5 cm mobile hard subcutaneous mass present along the medial aspect of the surgical scar.  Oncology notes reviewed Assessment  Recurrent left breast carcinoma Plan   Patient is scheduled for a wide excision of the recurrent left breast carcinoma on 02/16/2020. The risks and benefits of the procedure including bleeding, infection, and the need for further excision given unclear margins were fully explained to the patient, who gave informed consent.

## 2020-02-01 NOTE — H&P (Signed)
Virginia Blake; [id number]; Sep 06, 1950   HPI Patient is a 70 year old white female status post a left modified radical mastectomy in 2017 by Dr. Anthony Sar who is referred to my care by Dr. Delton Coombes of oncology for a recurrent malignancy along the medial aspect of the surgical scar. This is biopsy-proven to be positive. She underwent a PET scan which shows no other metastatic lesions. She has been referred to my care for a wide excision of the recurrence. Patient states that has been present for approximately 1 year and has been increasing in size. Past Medical History:  Diagnosis Date  . Breast cancer ([place]ia Blake)    left mastectomy  2017  . Family history of skin cancer   . Hx of skin cancer, basal cell    Right posterior upper arm    Past Surgical History:  Procedure Laterality Date  . ABDOMINAL HYSTERECTOMY  1998  . INCONTINENCE SURGERY    . TONSILLECTOMY    . TOTAL MASTECTOMY Left   . TUBAL LIGATION      Family History  Problem Relation Age of Onset  . Skin cancer Brother   . Skin cancer Paternal Uncle        nose  . Heart attack Maternal Grandmother   . Heart attack Maternal Grandfather   . Stroke Paternal Grandfather   . Skin cancer Brother   . Cancer Paternal Uncle        unknown type    Current Outpatient Medications on File Prior to Visit  Medication Sig Dispense Refill  . ALPRAZolam (XANAX) 1 MG tablet TK 1 T PO 5 TIMES PER DAY  5  . amoxicillin-clavulanate (AUGMENTIN) 875-125 MG tablet SMARTSIG:1 Tablet(s) By Mouth Every 12 Hours    . Ascorbic Acid (VITA-C PO) Take by mouth.    . COPPER PO Take by mouth.    . Multiple Vitamin (THERA) TABS Take by mouth.    . Multiple Vitamins-Minerals (ZINC PO) Take by mouth.    . N-ACETYL CYSTEINE PO Take by mouth.    Marland Kitchen NADH-ASCORBIC ACID-SOD BICARB PO Take by mouth.    Marland Kitchen VITAMIN A PO Take by mouth.    Marland Kitchen VITAMIN E PO Take by mouth.    Marland Kitchen albuterol (VENTOLIN HFA) 108 (90 Base) MCG/ACT inhaler SMARTSIG:2 Puff(s) By Mouth Every 4 Hours  (Patient not taking: No sig reported)    . guaiFENesin (ROBITUSSIN) 100 MG/5ML liquid Take 10 mLs by mouth every 4 (four) hours as needed. (Patient not taking: No sig reported)    . HYDROcodone-acetaminophen (NORCO) 10-325 MG tablet TAKE 1 TABLET BY MOUTH EVERY 4 HOURS (Patient not taking: No sig reported)  0  . ipratropium-albuterol (DUONEB) 0.5-2.5 (3) MG/3ML SOLN U 3 ML VIA NEB QID (Patient not taking: No sig reported)  11  . methylPREDNISolone (MEDROL DOSEPAK) 4 MG TBPK tablet Take by mouth as directed. (Patient not taking: No sig reported)     No current facility-administered medications on file prior to visit.    Allergies  Allergen Reactions  . Ciprofloxacin Anxiety  . Doxycycline     Social History   Substance and Sexual Activity  Alcohol Use No    Social History   Tobacco Use  Smoking Status Current Every Day Smoker  . Packs/day: 1.00  . Types: Cigarettes  Smokeless Tobacco Never Used    Review of Systems  Constitutional: Positive for malaise/fatigue.  HENT: Negative.   Eyes: Positive for blurred vision and double vision.  Respiratory: Positive for cough, shortness of breath  and wheezing.   Cardiovascular: Negative.   Gastrointestinal: Positive for heartburn and nausea.  Genitourinary: Positive for dysuria, frequency and urgency.  Musculoskeletal: Positive for back pain, joint pain and neck pain.  Skin: Negative.   Neurological: Negative.   Endo/Heme/Allergies: Negative.   Psychiatric/Behavioral: Negative.     Objective   Vitals:   02/01/20 0946  BP: 136/80  Pulse: 75  Resp: 16  Temp: 97.8 F (36.6 C)  SpO2: 93%    Physical Exam Vitals reviewed.  Constitutional:      Appearance: Normal appearance. She is normal weight. She is not ill-appearing.  HENT:     Head: Normocephalic and atraumatic.  Cardiovascular:     Rate and Rhythm: Normal rate and regular rhythm.     Heart sounds: Normal heart sounds. No murmur heard. No friction rub. No gallop.    Pulmonary:     Effort: Pulmonary effort is normal. No respiratory distress.     Breath sounds: Normal breath sounds. No stridor. No wheezing, rhonchi or rales.  Skin:    General: Skin is warm and dry.  Neurological:     Mental Status: She is alert and oriented to person, place, and time.   Breast: Left mastectomy surgical scar well-healed with a 1.5 cm mobile hard subcutaneous mass present along the medial aspect of the surgical scar.  Oncology notes reviewed Assessment  Recurrent left breast carcinoma Plan   Patient is scheduled for a wide excision of the recurrent left breast carcinoma on 02/16/2020. The risks and benefits of the procedure including bleeding, infection, and the need for further excision given unclear margins were fully explained to the patient, who gave informed consent. 

## 2020-02-13 ENCOUNTER — Encounter (HOSPITAL_COMMUNITY)
Admission: RE | Admit: 2020-02-13 | Discharge: 2020-02-13 | Disposition: A | Discharge status: Home / Self Care | Payer: Medicare Other | Source: Ambulatory Visit | Attending: General Surgery | Admitting: General Surgery

## 2020-02-14 ENCOUNTER — Other Ambulatory Visit (HOSPITAL_COMMUNITY)
Admission: RE | Admit: 2020-02-14 | Discharge: 2020-02-14 | Disposition: A | Discharge status: Home / Self Care | Payer: Medicare Other | Source: Ambulatory Visit | Attending: General Surgery | Admitting: General Surgery

## 2020-02-14 ENCOUNTER — Other Ambulatory Visit: Payer: Self-pay

## 2020-02-14 DIAGNOSIS — Z01812 Encounter for preprocedural laboratory examination: Secondary | ICD-10-CM | POA: Insufficient documentation

## 2020-02-14 DIAGNOSIS — Z20822 Contact with and (suspected) exposure to covid-19: Secondary | ICD-10-CM | POA: Insufficient documentation

## 2020-02-15 ENCOUNTER — Other Ambulatory Visit: Payer: Self-pay

## 2020-02-15 ENCOUNTER — Encounter (HOSPITAL_COMMUNITY): Payer: Self-pay

## 2020-02-15 LAB — SARS CORONAVIRUS 2 (TAT 6-24 HRS): SARS Coronavirus 2: NEGATIVE

## 2020-02-16 ENCOUNTER — Encounter (HOSPITAL_COMMUNITY)
Admission: RE | Disposition: A | Discharge status: Home / Self Care | Payer: Self-pay | Source: Home / Self Care | Attending: General Surgery

## 2020-02-16 ENCOUNTER — Encounter (HOSPITAL_COMMUNITY): Payer: Self-pay | Admitting: General Surgery

## 2020-02-16 ENCOUNTER — Ambulatory Visit (HOSPITAL_COMMUNITY): Payer: Medicare Other | Admitting: Certified Registered Nurse Anesthetist

## 2020-02-16 ENCOUNTER — Ambulatory Visit (HOSPITAL_COMMUNITY)
Admission: RE | Admit: 2020-02-16 | Discharge: 2020-02-16 | Disposition: A | Discharge status: Home / Self Care | Payer: Medicare Other | Attending: General Surgery | Admitting: General Surgery

## 2020-02-16 DIAGNOSIS — Z809 Family history of malignant neoplasm, unspecified: Secondary | ICD-10-CM | POA: Diagnosis not present

## 2020-02-16 DIAGNOSIS — Z90710 Acquired absence of both cervix and uterus: Secondary | ICD-10-CM | POA: Diagnosis not present

## 2020-02-16 DIAGNOSIS — C50912 Malignant neoplasm of unspecified site of left female breast: Secondary | ICD-10-CM

## 2020-02-16 DIAGNOSIS — Z881 Allergy status to other antibiotic agents status: Secondary | ICD-10-CM | POA: Diagnosis not present

## 2020-02-16 DIAGNOSIS — Z808 Family history of malignant neoplasm of other organs or systems: Secondary | ICD-10-CM | POA: Diagnosis not present

## 2020-02-16 DIAGNOSIS — Z79899 Other long term (current) drug therapy: Secondary | ICD-10-CM | POA: Diagnosis not present

## 2020-02-16 DIAGNOSIS — Z9012 Acquired absence of left breast and nipple: Secondary | ICD-10-CM | POA: Diagnosis not present

## 2020-02-16 DIAGNOSIS — F17210 Nicotine dependence, cigarettes, uncomplicated: Secondary | ICD-10-CM | POA: Diagnosis not present

## 2020-02-16 HISTORY — PX: MASTECTOMY, PARTIAL: SHX709

## 2020-02-16 SURGERY — MASTECTOMY PARTIAL
Anesthesia: General | Site: Breast | Laterality: Left

## 2020-02-16 MED ORDER — PHENYLEPHRINE HCL (PRESSORS) 10 MG/ML IV SOLN
INTRAVENOUS | Status: DC | PRN
  Administered 2020-02-16: 40 ug via INTRAVENOUS

## 2020-02-16 MED ORDER — ALBUTEROL SULFATE HFA 108 (90 BASE) MCG/ACT IN AERS
INHALATION_SPRAY | RESPIRATORY_TRACT | Status: DC | PRN
  Administered 2020-02-16: 6 via RESPIRATORY_TRACT

## 2020-02-16 MED ORDER — MIDAZOLAM HCL 2 MG/2ML IJ SOLN
INTRAMUSCULAR | Status: AC
  Filled 2020-02-16: qty 2

## 2020-02-16 MED ORDER — CHLORHEXIDINE GLUCONATE CLOTH 2 % EX PADS: 6.0000 | MEDICATED_PAD | Freq: Once | CUTANEOUS | Status: DC

## 2020-02-16 MED ORDER — FENTANYL CITRATE (PF) 100 MCG/2ML IJ SOLN
25.0000 ug | INTRAMUSCULAR | Status: DC | PRN
  Administered 2020-02-16: 50 ug via INTRAVENOUS
  Filled 2020-02-16: qty 2

## 2020-02-16 MED ORDER — PHENYLEPHRINE 40 MCG/ML (10ML) SYRINGE FOR IV PUSH (FOR BLOOD PRESSURE SUPPORT)
PREFILLED_SYRINGE | INTRAVENOUS | Status: AC
  Filled 2020-02-16: qty 10

## 2020-02-16 MED ORDER — ONDANSETRON HCL 4 MG/2ML IJ SOLN
INTRAMUSCULAR | Status: AC
  Filled 2020-02-16: qty 2

## 2020-02-16 MED ORDER — BUPIVACAINE HCL (PF) 0.5 % IJ SOLN
INTRAMUSCULAR | Status: AC
  Filled 2020-02-16: qty 30

## 2020-02-16 MED ORDER — ONDANSETRON HCL 4 MG/2ML IJ SOLN: 4.0000 mg | Freq: Once | INTRAMUSCULAR | Status: DC | PRN

## 2020-02-16 MED ORDER — PROPOFOL 10 MG/ML IV BOLUS
INTRAVENOUS | Status: AC
  Filled 2020-02-16: qty 20

## 2020-02-16 MED ORDER — MIDAZOLAM HCL 2 MG/2ML IJ SOLN
INTRAMUSCULAR | Status: DC | PRN
  Administered 2020-02-16: 1 mg via INTRAVENOUS

## 2020-02-16 MED ORDER — PROPOFOL 10 MG/ML IV BOLUS
INTRAVENOUS | Status: DC | PRN
  Administered 2020-02-16: 140 mg via INTRAVENOUS

## 2020-02-16 MED ORDER — FENTANYL CITRATE (PF) 250 MCG/5ML IJ SOLN
INTRAMUSCULAR | Status: DC | PRN
  Administered 2020-02-16 (×2): 50 ug via INTRAVENOUS

## 2020-02-16 MED ORDER — LACTATED RINGERS IV SOLN: INTRAVENOUS | Status: DC | PRN

## 2020-02-16 MED ORDER — LACTATED RINGERS IV SOLN
INTRAVENOUS | Status: DC
  Administered 2020-02-16: 1000 mL via INTRAVENOUS

## 2020-02-16 MED ORDER — ONDANSETRON HCL 4 MG/2ML IJ SOLN
INTRAMUSCULAR | Status: DC | PRN
  Administered 2020-02-16: 4 mg via INTRAVENOUS

## 2020-02-16 MED ORDER — DEXAMETHASONE SODIUM PHOSPHATE 10 MG/ML IJ SOLN
INTRAMUSCULAR | Status: DC | PRN
  Administered 2020-02-16: 4 mg via INTRAVENOUS

## 2020-02-16 MED ORDER — LIDOCAINE HCL (CARDIAC) PF 100 MG/5ML IV SOSY
PREFILLED_SYRINGE | INTRAVENOUS | Status: DC | PRN
  Administered 2020-02-16: 40 mg via INTRAVENOUS
  Administered 2020-02-16: 60 mg via INTRAVENOUS

## 2020-02-16 MED ORDER — BUPIVACAINE HCL (PF) 0.5 % IJ SOLN
INTRAMUSCULAR | Status: DC | PRN
  Administered 2020-02-16: 4 mL

## 2020-02-16 MED ORDER — LIDOCAINE HCL (PF) 2 % IJ SOLN
INTRAMUSCULAR | Status: AC
  Filled 2020-02-16: qty 5

## 2020-02-16 MED ORDER — FENTANYL CITRATE (PF) 250 MCG/5ML IJ SOLN
INTRAMUSCULAR | Status: AC
  Filled 2020-02-16: qty 5

## 2020-02-16 MED ORDER — ORAL CARE MOUTH RINSE: 15.0000 mL | Freq: Once | OROMUCOSAL | Status: AC

## 2020-02-16 MED ORDER — 0.9 % SODIUM CHLORIDE (POUR BTL) OPTIME
TOPICAL | Status: DC | PRN
  Administered 2020-02-16: 1000 mL

## 2020-02-16 MED ORDER — CHLORHEXIDINE GLUCONATE 0.12 % MT SOLN
15.0000 mL | Freq: Once | OROMUCOSAL | Status: AC
  Administered 2020-02-16: 15 mL via OROMUCOSAL

## 2020-02-16 SURGICAL SUPPLY — 29 items
ADH SKN CLS APL DERMABOND .7 (GAUZE/BANDAGES/DRESSINGS) ×1
APL PRP STRL LF DISP 70% ISPRP (MISCELLANEOUS) ×1
CHLORAPREP W/TINT 26 (MISCELLANEOUS) ×2 IMPLANT
CLOTH BEACON ORANGE TIMEOUT ST (SAFETY) ×2 IMPLANT
COVER LIGHT HANDLE STERIS (MISCELLANEOUS) ×4 IMPLANT
COVER WAND RF STERILE (DRAPES) ×2 IMPLANT
DECANTER SPIKE VIAL GLASS SM (MISCELLANEOUS) ×2 IMPLANT
DERMABOND ADVANCED (GAUZE/BANDAGES/DRESSINGS) ×1
DERMABOND ADVANCED .7 DNX12 (GAUZE/BANDAGES/DRESSINGS) ×1 IMPLANT
ELECT REM PT RETURN 9FT ADLT (ELECTROSURGICAL) ×2
ELECTRODE REM PT RTRN 9FT ADLT (ELECTROSURGICAL) ×1 IMPLANT
GLOVE ECLIPSE 6.5 STRL STRAW (GLOVE) ×2 IMPLANT
GLOVE SURG SS PI 7.5 STRL IVOR (GLOVE) ×2 IMPLANT
GLOVE SURG UNDER POLY LF SZ7 (GLOVE) ×4 IMPLANT
GOWN STRL REUS W/TWL LRG LVL3 (GOWN DISPOSABLE) ×4 IMPLANT
KIT TURNOVER KIT A (KITS) ×2 IMPLANT
MANIFOLD NEPTUNE II (INSTRUMENTS) ×2 IMPLANT
NEEDLE HYPO 25X1 1.5 SAFETY (NEEDLE) ×2 IMPLANT
NS IRRIG 1000ML POUR BTL (IV SOLUTION) ×2 IMPLANT
PACK MINOR (CUSTOM PROCEDURE TRAY) ×2 IMPLANT
PAD ARMBOARD 7.5X6 YLW CONV (MISCELLANEOUS) ×2 IMPLANT
PENCIL SMOKE EVACUATOR (MISCELLANEOUS) ×2 IMPLANT
SET BASIN LINEN APH (SET/KITS/TRAYS/PACK) ×2 IMPLANT
SPONGE LAP 18X18 RF (DISPOSABLE) ×2 IMPLANT
SUT MNCRL AB 4-0 PS2 18 (SUTURE) ×2 IMPLANT
SUT SILK 2 0 SH (SUTURE) IMPLANT
SUT VIC AB 3-0 SH 27 (SUTURE) ×2
SUT VIC AB 3-0 SH 27X BRD (SUTURE) ×1 IMPLANT
SYR CONTROL 10ML LL (SYRINGE) ×2 IMPLANT

## 2020-02-16 NOTE — Discharge Instructions (Signed)
Lumpectomy, Care After This sheet gives you information about how to care for yourself after your procedure. Your health care provider may also give you more specific instructions. If you have problems or questions, contact your health care provider. What can I expect after the procedure? After the procedure, it is common to have:  Breast swelling.  Breast tenderness.  Stiffness in your arm or shoulder.  A change in the shape and feel of your breast.  Scar tissue that feels hard to the touch in the area where the lump was removed. Follow these instructions at home: Medicines  Take over-the-counter and prescription medicines only as told by your health care provider.  If you were prescribed an antibiotic medicine, take it as told by your health care provider. Do not stop taking the antibiotic even if you start to feel better.  Ask your health care provider if the medicine prescribed to you: ? Requires you to avoid driving or using heavy machinery. ? Can cause constipation. You may need to take these actions to prevent or treat constipation:  Drink enough fluid to keep your urine pale yellow.  Take over-the-counter or prescription medicines.  Eat foods that are high in fiber, such as beans, whole grains, and fresh fruits and vegetables.  Limit foods that are high in fat and processed sugars, such as fried or sweet foods. Incision care  Follow instructions from your health care provider about how to take care of your incision. Make sure you: ? Wash your hands with soap and water before and after you change your bandage (dressing). If soap and water are not available, use hand sanitizer. ? Change your dressing as told by your health care provider. ? Leave stitches (sutures), skin glue, or adhesive strips in place. These skin closures may need to stay in place for 2 weeks or longer. If adhesive strip edges start to loosen and curl up, you may trim the loose edges. Do not remove  adhesive strips completely unless your health care provider tells you to do that.  Check your incision area every day for signs of infection. Check for: ? More redness, swelling, or pain. ? Fluid or blood. ? Warmth. ? Pus or a bad smell.  Keep your dressing clean and dry.  If you were sent home with a surgical drain in place, follow instructions from your health care provider about emptying it.      Bathing  Do not take baths, swim, or use a hot tub until your health care provider approves.  Ask your health care provider if you may take showers. You may only be allowed to take sponge baths. Activity  Rest as told by your health care provider.  Avoid sitting for a long time without moving. Get up to take short walks every 1-2 hours. This is important to improve blood flow and breathing. Ask for help if you feel weak or unsteady.  Return to your normal activities as told by your health care provider. Ask your health care provider what activities are safe for you.  Be careful to avoid any activities that could cause an injury to your arm on the side of your surgery.  Do not lift anything that is heavier than 10 lb (4.5 kg), or the limit that you are told, until your health care provider says that it is safe. Avoid lifting with the arm that is on the side of your surgery.  Do not carry heavy objects on your shoulder on the side of   your surgery.  Do exercises to keep your shoulder and arm from getting stiff and swollen. Talk with your health care provider about which exercises are safe for you. General instructions  Wear a supportive bra as told by your health care provider.  Raise (elevate) your arm above the level of your heart while you are sitting or lying down.  Do not wear tight jewelry on your arm, wrist, or fingers on the side of your surgery.  Keep all follow-up visits as told by your health care provider. This is important. ? You may need to be screened for extra fluid  around the lymph nodes and swelling in the breast and arm (lymphedema). Follow instructions from your health care provider about how often you should be checked.  If you had any lymph nodes removed during your procedure, be sure to tell all of your health care providers. This is important information to share before you are involved in certain procedures, such as having blood tests or having your blood pressure taken. Contact a health care provider if:  You develop a rash.  You have a fever.  Your pain medicine is not working.  You have swelling, weakness, or numbness in your arm that does not improve after a few weeks.  You have new swelling in your breast.  You have any of these signs of infection: ? More redness, swelling, or pain in your incision area. ? Fluid or blood coming from your incision. ? Warmth coming from the incision area. ? Pus or a bad smell coming from your incision. Get help right away if you have:  Very bad pain in your breast or arm.  Swelling in your legs or arms.  Redness, warmth, or pain in your leg or arm.  Chest pain.  Difficulty breathing. Summary  After the procedure, it is common to have breast tenderness, swelling in your breast, and stiffness in your arm and shoulder.  Follow instructions from your health care provider about how to take care of your incision.  Do not lift anything that is heavier than 10 lb (4.5 kg), or the limit that you are told, until your health care provider says that it is safe. Avoid lifting with the arm that is on the side of your surgery.  If you had any lymph nodes removed during your procedure, be sure to tell all of your health care providers. This is important information to share before you are involved in certain procedures, such as having blood tests or having your blood pressure taken. This information is not intended to replace advice given to you by your health care provider. Make sure you discuss any  questions you have with your health care provider. Document Revised: 07/11/2018 Document Reviewed: 07/11/2018 Elsevier Patient Education  2021 [place] After This sheet gives you information about how to care for yourself after your procedure. Your health care provider may also give you more specific instructions. If you have problems or questions, contact your health care provider. What can I expect after the procedure? After the procedure, it is common to have:  Tiredness.  Forgetfulness about what happened after the procedure.  Impaired judgment for important decisions.  Nausea or vomiting.  Some difficulty with balance. Follow these instructions at home: For the time period you were told by your health care provider:  Rest as needed.  Do not participate in activities where you could fall or become injured.  Do not drive or use  machinery.  Do not drink alcohol.  Do not take sleeping pills or medicines that cause drowsiness.  Do not make important decisions or sign legal documents.  Do not take care of children on your own.      Eating and drinking  Follow the diet that is recommended by your health care provider.  Drink enough fluid to keep your urine pale yellow.  If you vomit: ? Drink water, juice, or soup when you can drink without vomiting. ? Make sure you have little or no nausea before eating solid foods. General instructions  Have a responsible adult stay with you for the time you are told. It is important to have someone help care for you until you are awake and alert.  Take over-the-counter and prescription medicines only as told by your health care provider.  If you have sleep apnea, surgery and certain medicines can increase your risk for breathing problems. Follow instructions from your health care provider about wearing your sleep device: ? Anytime you are sleeping, including during daytime naps. ? While  taking prescription pain medicines, sleeping medicines, or medicines that make you drowsy.  Avoid smoking.  Keep all follow-up visits as told by your health care provider. This is important. Contact a health care provider if:  You keep feeling nauseous or you keep vomiting.  You feel light-headed.  You are still sleepy or having trouble with balance after 24 hours.  You develop a rash.  You have a fever.  You have redness or swelling around the IV site. Get help right away if:  You have trouble breathing.  You have new-onset confusion at home. Summary  For several hours after your procedure, you may feel tired. You may also be forgetful and have poor judgment.  Have a responsible adult stay with you for the time you are told. It is important to have someone help care for you until you are awake and alert.  Rest as told. Do not drive or operate machinery. Do not drink alcohol or take sleeping pills.  Get help right away if you have trouble breathing, or if you suddenly become confused. This information is not intended to replace advice given to you by your health care provider. Make sure you discuss any questions you have with your health care provider. Document Revised: 09/21/2019 Document Reviewed: 12/08/2018 Elsevier Patient Education  2021 [employer].

## 2020-02-16 NOTE — Transfer of Care (Signed)
Immediate Anesthesia Transfer of Care Note  Patient: Virginia Blake  Procedure(s) Performed: MASTECTOMY PARTIAL (Left Breast)  Patient Location: PACU  Anesthesia Type:General  Level of Consciousness: awake, alert  and oriented  Airway & Oxygen Therapy: Patient Spontanous Breathing  Post-op Assessment: Report given to RN and Post -op Vital signs reviewed and stable  Post vital signs: Reviewed and stable  Last Vitals:  Vitals Value Taken Time  BP 148/75 02/16/20 0959  Temp    Pulse 86 02/16/20 1000  Resp 19 02/16/20 1000  SpO2 100 % 02/16/20 1000  Vitals shown include unvalidated device data.  Last Pain:  Vitals:   02/16/20 0756  TempSrc: Oral  PainSc: 0-No pain      Patients Stated Pain Goal: 5 (37/48/27 0786)  Complications: No complications documented.

## 2020-02-16 NOTE — Anesthesia Postprocedure Evaluation (Signed)
Anesthesia Post Note  Patient: Virginia Blake  Procedure(s) Performed: MASTECTOMY PARTIAL (Left Breast)  Patient location during evaluation: PACU Anesthesia Type: General Level of consciousness: awake and oriented Pain management: satisfactory to patient Vital Signs Assessment: post-procedure vital signs reviewed and stable Respiratory status: spontaneous breathing and respiratory function stable Cardiovascular status: blood pressure returned to baseline Postop Assessment: no apparent nausea or vomiting Anesthetic complications: no Comments: Late Entry    No complications documented.   Last Vitals:  Vitals:   02/16/20 1015 02/16/20 1030  BP: 94/68 120/66  Pulse: 82 74  Resp: 19 16  Temp:    SpO2: 100% 100%    Last Pain:  Vitals:   02/16/20 1030  TempSrc:   PainSc: [place] Daqwan Dougal

## 2020-02-16 NOTE — Anesthesia Preprocedure Evaluation (Signed)
Anesthesia Evaluation  Patient identified by MRN, date of birth, ID band Patient awake    Reviewed: Allergy & Precautions, H&P , NPO status , Patient's Chart, lab work & pertinent test results, reviewed documented beta blocker date and time   Airway Mallampati: II  TM Distance: >3 FB Neck ROM: full    Dental no notable dental hx. (+) Teeth Intact   Pulmonary COPD, Current Smoker,    Pulmonary exam normal breath sounds clear to auscultation       Cardiovascular Exercise Tolerance: Good negative cardio ROS   Rhythm:regular Rate:Normal     Neuro/Psych PSYCHIATRIC DISORDERS Anxiety  Neuromuscular disease    GI/Hepatic negative GI ROS, Neg liver ROS,   Endo/Other  negative endocrine ROS  Renal/GU negative Renal ROS  negative genitourinary   Musculoskeletal   Abdominal   Peds  Hematology negative hematology ROS (+)   Anesthesia Other Findings   Reproductive/Obstetrics negative OB ROS                             Anesthesia Physical Anesthesia Plan  ASA: II  Anesthesia Plan: General   Post-op Pain Management:    Induction:   PONV Risk Score and Plan: Propofol infusion and TIVA  Airway Management Planned:   Additional Equipment:   Intra-op Plan:   Post-operative Plan:   Informed Consent: I have reviewed the patients History and Physical, chart, labs and discussed the procedure including the risks, benefits and alternatives for the proposed anesthesia with the patient or authorized representative who has indicated his/her understanding and acceptance.     Dental Advisory Given  Plan Discussed with: CRNA  Anesthesia Plan Comments:         Anesthesia Quick Evaluation

## 2020-02-16 NOTE — Op Note (Signed)
Patient:  Virginia Blake  DOB:  Sep 25, 1950  MRN:  [id number]   Preop Diagnosis: Recurrent left breast carcinoma  Postop Diagnosis: Same  Procedure: Left partial mastectomy  Surgeon: Aviva Signs, MD  Anes: General  Indications: Patient is a 70 year old white female status post a left modified radical mastectomy at another facility who now presents with biopsy-proven recurrence in the superior flap of the mastectomy site.  The risks and benefits of the procedure including bleeding, infection, and the possibility of involved margins were fully explained to the patient, who gave informed consent.  Procedure note: The patient was placed in the supine position.  After general anesthesia was administered, the left chest wall was prepped and draped using the usual sterile technique with ChloraPrep.  Surgical site confirmation was performed.  An elliptical incision was made around the palpable mass that was along the medial aspect and just superior to the previous mastectomy surgical scar.  The dissection was taken down to the chest wall.  Grossly, normal breast tissue was noted around the cancer.  A suture was placed superiorly for orientation purposes.  The specimen was then removed and sent to pathology for further examination.  A bleeding was controlled using Bovie electrocautery.  0.5% Sensorcaine was instilled in the surrounding wound.  The subcutaneous layer was reapproximated using a 3-0 Vicryl interrupted suture.  The skin was closed using a 4-0 Monocryl subcuticular suture.  Dermabond was applied to incision.  All tape and needle counts were correct at the end of the procedure.  The patient was awakened and transferred to PACU in stable condition.    Complications: None  EBL: Minimal  Specimen: Left breast tumor, suture superior

## 2020-02-16 NOTE — Anesthesia Procedure Notes (Signed)
Procedure Name: LMA Insertion Date/Time: 02/16/2020 9:12 AM Performed by: Hewitt Blade, CRNA Pre-anesthesia Checklist: Patient identified, Emergency Drugs available, Suction available and Patient being monitored Patient Re-evaluated:Patient Re-evaluated prior to induction Oxygen Delivery Method: Circle system utilized Preoxygenation: Pre-oxygenation with 100% oxygen Induction Type: IV induction Ventilation: Mask ventilation without difficulty LMA: LMA inserted LMA Size: 4.0 Grade View: Grade I Tube type: Oral Number of attempts: 2 Placement Confirmation: positive ETCO2 and breath sounds checked- equal and bilateral Tube secured with: Tape Dental Injury: Teeth and Oropharynx as per pre-operative assessment

## 2020-02-16 NOTE — Interval H&P Note (Signed)
History and Physical Interval Note:  02/16/2020 8:26 AM  Virginia Blake  has presented today for surgery, with the diagnosis of Recurrent left breast cancer.  The various methods of treatment have been discussed with the patient and family. After consideration of risks, benefits and other options for treatment, the patient has consented to  Procedure(s): MASTECTOMY PARTIAL (Left) as a surgical intervention.  The patient's history has been reviewed, patient examined, no change in status, stable for surgery.  I have reviewed the patient's chart and labs.  Questions were answered to the patient's satisfaction.     Aviva Signs

## 2020-02-19 ENCOUNTER — Encounter (HOSPITAL_COMMUNITY): Payer: Self-pay | Admitting: General Surgery

## 2020-02-19 LAB — SURGICAL PATHOLOGY

## 2020-02-22 ENCOUNTER — Telehealth (INDEPENDENT_AMBULATORY_CARE_PROVIDER_SITE_OTHER): Payer: Medicare Other | Admitting: General Surgery

## 2020-02-22 DIAGNOSIS — Z09 Encounter for follow-up examination after completed treatment for conditions other than malignant neoplasm: Secondary | ICD-10-CM

## 2020-02-22 NOTE — Telephone Encounter (Signed)
Called patient to give her the pathology results.  Margins were involved, I told her that I would need to reexcise the area in a few weeks.  She understands and agrees.  Will see her in the office in three weeks to check her wound and schedule surgery.

## 2020-04-16 ENCOUNTER — Ambulatory Visit: Payer: Medicare Other | Admitting: General Surgery

## 2020-04-18 ENCOUNTER — Encounter: Payer: Self-pay | Admitting: General Surgery

## 2020-04-18 ENCOUNTER — Ambulatory Visit (HOSPITAL_COMMUNITY): Payer: Medicare Other | Admitting: Hematology

## 2020-04-18 ENCOUNTER — Other Ambulatory Visit: Payer: Self-pay

## 2020-04-18 ENCOUNTER — Ambulatory Visit (INDEPENDENT_AMBULATORY_CARE_PROVIDER_SITE_OTHER): Payer: Medicare Other | Admitting: General Surgery

## 2020-04-18 VITALS — BP 138/73 | HR 80 | Temp 98.4°F | Resp 16 | Ht 59.0 in | Wt 132.0 lb

## 2020-04-18 DIAGNOSIS — C50912 Malignant neoplasm of unspecified site of left female breast: Secondary | ICD-10-CM

## 2020-04-18 NOTE — H&P (Signed)
Virginia Blake; [id number]; 10/14/1950   HPI Patient is a 70 year old white female status post a left modified radical mastectomy in 2017 by Dr. Anthony Sar who is referred to my care by Dr. Delton Coombes of oncology for a recurrent malignancy along the medial aspect of the surgical scar. This is biopsy-proven to be positive. She underwent a PET scan which shows no other metastatic lesions. She has been referred to my care for a wide excision of the recurrence. Patient states that has been present for approximately 1 year and has been increasing in size.  Patient underwent left partial mastectomy in January of this year.  Final pathology revealed positive surgical margins. Past Medical History:  Diagnosis Date  . Breast cancer ([place])    left mastectomy  2017  . Family history of skin cancer   . Hx of skin cancer, basal cell    Right posterior upper arm    Past Surgical History:  Procedure Laterality Date  . ABDOMINAL HYSTERECTOMY  1998  . INCONTINENCE SURGERY    . TONSILLECTOMY    . TOTAL MASTECTOMY Left   . TUBAL LIGATION      Family History  Problem Relation Age of Onset  . Skin cancer Brother   . Skin cancer Paternal Uncle        nose  . Heart attack Maternal Grandmother   . Heart attack Maternal Grandfather   . Stroke Paternal Grandfather   . Skin cancer Brother   . Cancer Paternal Uncle        unknown type    Current Outpatient Medications on File Prior to Visit  Medication Sig Dispense Refill  . ALPRAZolam (XANAX) 1 MG tablet TK 1 T PO 5 TIMES PER DAY  5  . amoxicillin-clavulanate (AUGMENTIN) 875-125 MG tablet SMARTSIG:1 Tablet(s) By Mouth Every 12 Hours    . Ascorbic Acid (VITA-C PO) Take by mouth.    . COPPER PO Take by mouth.    . Multiple Vitamin (THERA) TABS Take by mouth.    . Multiple Vitamins-Minerals (ZINC PO) Take by mouth.    . N-ACETYL CYSTEINE PO Take by mouth.    Marland Kitchen NADH-ASCORBIC ACID-SOD BICARB PO Take by mouth.    Marland Kitchen VITAMIN A PO Take by mouth.    Marland Kitchen VITAMIN E  PO Take by mouth.    Marland Kitchen albuterol (VENTOLIN HFA) 108 (90 Base) MCG/ACT inhaler SMARTSIG:2 Puff(s) By Mouth Every 4 Hours (Patient not taking: No sig reported)    . guaiFENesin (ROBITUSSIN) 100 MG/5ML liquid Take 10 mLs by mouth every 4 (four) hours as needed. (Patient not taking: No sig reported)    . HYDROcodone-acetaminophen (NORCO) 10-325 MG tablet TAKE 1 TABLET BY MOUTH EVERY 4 HOURS (Patient not taking: No sig reported)  0  . ipratropium-albuterol (DUONEB) 0.5-2.5 (3) MG/3ML SOLN U 3 ML VIA NEB QID (Patient not taking: No sig reported)  11  . methylPREDNISolone (MEDROL DOSEPAK) 4 MG TBPK tablet Take by mouth as directed. (Patient not taking: No sig reported)     No current facility-administered medications on file prior to visit.    Allergies  Allergen Reactions  . Ciprofloxacin Anxiety  . Doxycycline     Social History   Substance and Sexual Activity  Alcohol Use No    Social History   Tobacco Use  Smoking Status Current Every Day Smoker  . Packs/day: 1.00  . Types: Cigarettes  Smokeless Tobacco Never Used    Review of Systems  Constitutional: Positive for malaise/fatigue.  HENT: Negative.  Eyes: Positive for blurred vision and double vision.  Respiratory: Positive for cough, shortness of breath and wheezing.   Cardiovascular: Negative.   Gastrointestinal: Positive for heartburn and nausea.  Genitourinary: Positive for dysuria, frequency and urgency.  Musculoskeletal: Positive for back pain, joint pain and neck pain.  Skin: Negative.   Neurological: Negative.   Endo/Heme/Allergies: Negative.   Psychiatric/Behavioral: Negative.     Objective   Vitals:   02/01/20 0946  BP: 136/80  Pulse: 75  Resp: 16  Temp: 97.8 F (36.6 C)  SpO2: 93%    Physical Exam Vitals reviewed.  Constitutional:      Appearance: Normal appearance. She is normal weight. She is not ill-appearing.  HENT:     Head: Normocephalic and atraumatic.  Cardiovascular:     Rate and  Rhythm: Normal rate and regular rhythm.     Heart sounds: Normal heart sounds. No murmur heard. No friction rub. No gallop.   Pulmonary:     Effort: Pulmonary effort is normal. No respiratory distress.     Breath sounds: Normal breath sounds. No stridor. No wheezing, rhonchi or rales.  Skin:    General: Skin is warm and dry.  Neurological:     Mental Status: She is alert and oriented to person, place, and time.   Breast: Left mastectomy surgical scar well-healed   Oncology notes reviewed Assessment  Recurrent left breast carcinoma, status post left partial mastectomy with positive surgical margins on pathology. Plan   Patient is scheduled for a reexcision of the recurrent left breast carcinoma on 05/10/2020. The risks and benefits of the procedure including bleeding, infection, and the need for further excision given unclear margins were fully explained to the patient, who gave informed consent.

## 2020-04-18 NOTE — Progress Notes (Signed)
Subjective:     Virginia Blake  Patient returns to schedule reexcision surgery as she had multiple positive margins from her left partial mastectomy for left recurrent breast cancer.  She is otherwise doing well from her surgery. Objective:    BP 138/73   Pulse 80   Temp 98.4 F (36.9 C) (Other (Comment))   Resp 16   Ht 4\' 11"  (1.499 m)   Wt 132 lb (59.9 kg)   SpO2 97%   BMI 26.66 kg/m   General:  alert, cooperative and no distress  Left mastectomy surgical site healing well.     Assessment:    Status post left partial mastectomy for recurrent left breast cancer, positive margins    Plan:   Patient is scheduled for a left partial mastectomy for recurrent left breast cancer on 05/10/2020.  The risks and benefits of the procedure including bleeding, infection, and unclear margins were fully explained to the patient, who gave informed consent.

## 2020-04-23 ENCOUNTER — Inpatient Hospital Stay (HOSPITAL_COMMUNITY): Payer: Medicare Other | Attending: Hematology | Admitting: Hematology

## 2020-04-23 ENCOUNTER — Other Ambulatory Visit: Payer: Self-pay

## 2020-04-23 VITALS — HR 93 | Temp 97.0°F | Resp 17 | Wt 133.6 lb

## 2020-04-23 DIAGNOSIS — C50412 Malignant neoplasm of upper-outer quadrant of left female breast: Secondary | ICD-10-CM | POA: Diagnosis not present

## 2020-04-23 DIAGNOSIS — R918 Other nonspecific abnormal finding of lung field: Secondary | ICD-10-CM | POA: Insufficient documentation

## 2020-04-23 DIAGNOSIS — M810 Age-related osteoporosis without current pathological fracture: Secondary | ICD-10-CM | POA: Insufficient documentation

## 2020-04-23 DIAGNOSIS — Z170 Estrogen receptor positive status [ER+]: Secondary | ICD-10-CM

## 2020-04-23 DIAGNOSIS — C50912 Malignant neoplasm of unspecified site of left female breast: Secondary | ICD-10-CM | POA: Insufficient documentation

## 2020-04-23 DIAGNOSIS — Z9012 Acquired absence of left breast and nipple: Secondary | ICD-10-CM | POA: Insufficient documentation

## 2020-04-23 DIAGNOSIS — F1210 Cannabis abuse, uncomplicated: Secondary | ICD-10-CM | POA: Diagnosis not present

## 2020-04-23 NOTE — Patient Instructions (Signed)
[place] at [facility] Discharge Instructions  You were seen today by Dr. Delton Coombes. He went over your recent results. You may proceed with your surgery to have the remainder of the mass removed. Dr. Delton Coombes will see you back in 6 weeks for follow up.   Thank you for choosing [place] at [facility] to provide your oncology and hematology care.  To afford each patient quality time with our provider, please arrive at least 15 minutes before your scheduled appointment time.   If you have a lab appointment with the [facility] please come in thru the Main Entrance and check in at the main information desk  You need to re-schedule your appointment should you arrive 10 or more minutes late.  We strive to give you quality time with our providers, and arriving late affects you and other patients whose appointments are after yours.  Also, if you no show three or more times for appointments you may be dismissed from the clinic at the providers discretion.     Again, thank you for choosing [facility].  Our hope is that these requests will decrease the amount of time that you wait before being seen by our physicians.       _____________________________________________________________  Should you have questions after your visit to [facility], please contact our office at [phone] between the hours of 8:00 a.m. and 4:30 p.m.  Voicemails left after 4:00 p.m. will not be returned until the following business day.  For prescription refill requests, have your pharmacy contact our office and allow 72 hours.    Cancer Center Support Programs:   > Cancer Support Group  2nd Tuesday of the month 1pm-2pm, Journey Room

## 2020-04-23 NOTE — Progress Notes (Signed)
[place] [address] [place] [address]   Patient Care Team: Neale Burly, MD as PCP - General (Internal Medicine) Brien Mates, RN as Oncology Nurse Navigator (Oncology)  SUMMARY OF ONCOLOGIC HISTORY: Oncology History   No history exists.    CHIEF COMPLIANT: Follow-up for left breast cancer   INTERVAL HISTORY: [name] is a 70 y.o. female here today for follow up of her left breast cancer. Her last visit was on 01/18/2020.   Today she reports feeling fair. She had a left partial mastectomy by Dr. Arnoldo Morale on 01/28 and is scheduled to have another partial mastectomy on 04/22 for positive margins. Her left mastectomy is healing well. She did not receive radiation to the site of her breast cancer.   REVIEW OF SYSTEMS:   Review of Systems  Constitutional: Positive for appetite change (25%) and fatigue (25%).  Respiratory: Positive for shortness of breath.   Cardiovascular: Positive for palpitations.  Gastrointestinal: Positive for nausea and vomiting.  Genitourinary: Positive for difficulty urinating (frequent UTI).   Musculoskeletal: Positive for arthralgias (4/10 R hip pain).  Neurological: Positive for dizziness.  Psychiatric/Behavioral: Positive for depression. The patient is nervous/anxious.   All other systems reviewed and are negative.   I have reviewed the past medical history, past surgical history, social history and family history with the patient and they are unchanged from previous note.   ALLERGIES:   is allergic to ciprofloxacin, aspirin, doxycycline, and nsaids.   MEDICATIONS:  Current Outpatient Medications  Medication Sig Dispense Refill  . albuterol (VENTOLIN HFA) 108 (90 Base) MCG/ACT inhaler Inhale into the lungs every 4 (four) hours as needed for wheezing or shortness of breath.    . ALPRAZolam (XANAX) 1 MG tablet Take 1 mg by mouth 4 (four) times daily as needed for anxiety.  5  . ascorbic acid (VITAMIN C)  500 MG tablet Take 500 mg by mouth daily.    . cholecalciferol (VITAMIN D3) 25 MCG (1000 UNIT) tablet Take 1,000 Units by mouth in the morning and at bedtime.    Marland Kitchen HYDROcodone-acetaminophen (NORCO) 10-325 MG tablet Take by mouth every 6 (six) hours as needed for severe pain.  0  . ipratropium-albuterol (DUONEB) 0.5-2.5 (3) MG/3ML SOLN Inhale into the lungs every 6 (six) hours as needed (asthma).  11  . magnesium oxide (MAG-OX) 400 MG tablet Take 400 mg by mouth 2 (two) times daily.    Marland Kitchen OVER THE COUNTER MEDICATION Take 1 capsule by mouth in the morning and at bedtime. Super Beets    . sulfamethoxazole-trimethoprim (BACTRIM) 400-80 MG tablet Take 1 tablet by mouth 2 (two) times daily.     No current facility-administered medications for this visit.     PHYSICAL EXAMINATION: Performance status (ECOG): 1 - Symptomatic but completely ambulatory  Vitals:   04/23/20 1048  Pulse: 93  Resp: 17  Temp: (!) 97 F (36.1 C)  SpO2: 94%   Wt Readings from Last 3 Encounters:  04/23/20 133 lb 9.6 oz (60.6 kg)  04/18/20 132 lb (59.9 kg)  02/15/20 134 lb (60.8 kg)   Physical Exam Vitals reviewed.  Constitutional:      Appearance: Normal appearance.  Cardiovascular:     Rate and Rhythm: Normal rate and regular rhythm.     Pulses: Normal pulses.     Heart sounds: Normal heart sounds.  Pulmonary:     Effort: Pulmonary effort is normal.     Breath sounds: Normal breath sounds.  Chest:  Breasts:     Right: No axillary adenopathy or supraclavicular adenopathy.     Left: Absent. No swelling, mass, tenderness, axillary adenopathy or supraclavicular adenopathy.    Abdominal:     Palpations: Abdomen is soft. There is no hepatomegaly or mass.     Tenderness: There is no abdominal tenderness.  Lymphadenopathy:     Cervical: No cervical adenopathy.     Upper Body:     Right upper body: No supraclavicular, axillary or pectoral adenopathy.     Left upper body: No supraclavicular, axillary or  pectoral adenopathy.  Neurological:     General: No focal deficit present.     Mental Status: She is alert and oriented to person, place, and time.  Psychiatric:        Mood and Affect: Mood normal.        Behavior: Behavior normal.     Breast Exam Chaperone: Milinda Antis, MD     LABORATORY DATA:  I have reviewed the data as listed CMP Latest Ref Rng & Units 11/30/2019 11/10/2016 10/28/2016  Glucose 70 - 99 mg/dL 97 87 107(H)  BUN 8 - 23 mg/dL 7(L) 10 37(H)  Creatinine 0.44 - 1.00 mg/dL 0.65 0.62 2.09(H)  Sodium 135 - 145 mmol/L 136 140 136  Potassium 3.5 - 5.1 mmol/L 3.9 4.4 4.7  Chloride 98 - 111 mmol/L 98 101 105  CO2 22 - 32 mmol/L $RemoveB'27 31 22  'WUnCzWVc$ Calcium 8.9 - 10.3 mg/dL 9.2 9.7 6.8(L)  Total Protein 6.5 - 8.1 g/dL 7.5 7.3 7.2  Total Bilirubin 0.3 - 1.2 mg/dL 0.5 0.5 0.6  Alkaline Phos 38 - 126 U/L 60 71 169(H)  AST 15 - 41 U/L $Remo'26 23 22  'cVxKz$ ALT 0 - 44 U/L $Remo'16 15 18   'DSmpo$ Lab Results  Component Value Date   CAN153 15.1 11/30/2019   Lab Results  Component Value Date   WBC 5.8 11/30/2019   HGB 13.5 11/30/2019   HCT 43.5 11/30/2019   MCV 95.2 11/30/2019   PLT 249 11/30/2019   NEUTROABS 2.8 11/30/2019   Surgical pathology ([id number]) on 02/16/2020: Left breast partial mastectomy: invasive ductal carcinoma, 1 cm; ER positive, PR/HER-2 negative.  ASSESSMENT:  1.Recurrent left breast cancer: -Patient recently noticed a nodule in the left breast for the past several months. -As it was growing, she had mammogram right breast and ultrasound guided biopsy of the left breast on 11/06/2019 at [facility], [place]. -Pathology on 11/01/2019 consistent with grade 2 invasive ductal carcinoma, ER 95%, PR 0%, Ki-67 10%, HER-2 2+ by IHC, negative by FISH. -PET scan on 01/08/2020 with tiny soft tissue nodule in the medial aspect of the left mastectomy bed with low-level FDG accumulation compatible with biopsy-proven malignancy with no evidence of metastatic disease.  Tiny lung nodules  nonspecific.  2.  Left breast cancer (stage Ia): -Status post left mastectomy and sentinel lymph node biopsy on 02/05/2015 by Dr. Anthony Sar in [place]. -Pathology shows 6 mm, grade 1, positive lymphovascular invasion, single focus, margins negative, ER more than 90%, PR 10%, Ki-67 26%, HER-2 2+, negative by FISH. -She did not take antiestrogen therapy as she was afraid of the side effects.   3. Social/family history: -Lives at home with ex-husband. She used to do office work. -Current active smoker 1-2 packs/day for 31 years. -Paternal uncle had cancer, type unknown to the patient.  4. Osteoporosis: -Last bone density on 10/28/2016 shows T score -2.9. -She was not treated for osteoporosis. -DEXA scan on 11/20/2019 with T  score -3.2.   PLAN:  1.Recurrent left breast cancer, ER positive, PR and HER-2 negative: -Excision of the left breast nodule on 02/16/2020 by Dr. Arnoldo Morale. 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  Infiltrating ductal carcinoma involving superior and inferior margins and medial margin.  Tumor measures 1 x 0.8 x 0.8 cm.  ER was 95% positive, PR negative, HER-2 2+ and negative by FISH.  Ki-67 10%. -Because of positive margins, reexcision is being planned on 05/10/2020. -Recommend follow-up in 4 weeks after surgery.  2. Osteoporosis: -She will have dental evaluation done. -She will continue calcium and vitamin D.  We will plan on starting her on Prolia at that time.   Breast Cancer therapy associated bone loss: I have recommended calcium, Vitamin D and weight bearing exercises.   No orders of the defined types were placed in this encounter.  The patient has a good understanding of the overall plan. she agrees with it. she will call with any problems that may develop before the next visit here.    Derek Jack, MD [place] [phone]   I, Milinda Antis, am acting as a  scribe for Dr. Sanda Linger.  I, Derek Jack MD, have reviewed the above documentation for accuracy and completeness, and I agree with the above.

## 2020-05-02 NOTE — Patient Instructions (Signed)
Your procedure is scheduled on: 05/10/2020  Report to [place] [facility] at    6:15 AM.  Call this number if you have problems the morning of surgery: [phone]   Remember:   Do not Eat or Drink after midnight         No Smoking the morning of surgery  :  Take these medicines the morning of surgery with A SIP OF WATER: xanax and hydrocodone if needed              Use inhalers if needed   Do not wear jewelry, make-up or nail polish.  Do not wear lotions, powders, or perfumes. You may wear deodorant.  Do not shave 48 hours prior to surgery. Men may shave face and neck.  Do not bring valuables to the hospital.  Contacts, dentures or bridgework may not be worn into surgery.  Leave suitcase in the car. After surgery it may be brought to your room.  For patients admitted to the hospital, checkout time is 11:00 AM the day of discharge.   Patients discharged the day of surgery will not be allowed to drive home.    Special Instructions: Shower using CHG night before surgery and shower the day of surgery use CHG.  Use special wash - you have one bottle of CHG for all showers.  You should use approximately 1/2 of the bottle for each shower.  How to Use Chlorhexidine for Bathing Chlorhexidine gluconate (CHG) is a germ-killing (antiseptic) solution that is used to clean the skin. It can get rid of the bacteria that normally live on the skin and can keep them away for about 24 hours. To clean your skin with CHG, you may be given:  A CHG solution to use in the shower or as part of a sponge bath.  A prepackaged cloth that contains CHG. Cleaning your skin with CHG may help lower the risk for infection:  While you are staying in the intensive care unit of the hospital.  If you have a vascular access, such as a central line, to provide short-term or long-term access to your veins.  If you have a catheter to drain urine from your bladder.  If you are on a ventilator. A ventilator is a  machine that helps you breathe by moving air in and out of your lungs.  After surgery. What are the risks? Risks of using CHG include:  A skin reaction.  Hearing loss, if CHG gets in your ears.  Eye injury, if CHG gets in your eyes and is not rinsed out.  The CHG product catching fire. Make sure that you avoid smoking and flames after applying CHG to your skin. Do not use CHG:  If you have a chlorhexidine allergy or have previously reacted to chlorhexidine.  On babies younger than 21 months of age. How to use CHG solution  Use CHG only as told by your health care provider, and follow the instructions on the label.  Use the full amount of CHG as directed. Usually, this is one bottle. During a shower Follow these steps when using CHG solution during a shower (unless your health care provider gives you different instructions): 1. Start the shower. 2. Use your normal soap and shampoo to wash your face and hair. 3. Turn off the shower or move out of the shower stream. 4. Pour the CHG onto a clean washcloth. Do not use any type of brush or rough-edged sponge. 5. Starting at your neck,  lather your body down to your toes. Make sure you follow these instructions: ? If you will be having surgery, pay special attention to the part of your body where you will be having surgery. Scrub this area for at least 1 minute. ? Do not use CHG on your head or face. If the solution gets into your ears or eyes, rinse them well with water. ? Avoid your genital area. ? Avoid any areas of skin that have broken skin, cuts, or scrapes. ? Scrub your back and under your arms. Make sure to wash skin folds. 6. Let the lather sit on your skin for 1-2 minutes or as long as told by your health care provider. 7. Thoroughly rinse your entire body in the shower. Make sure that all body creases and crevices are rinsed well. 8. Dry off with a clean towel. Do not put any substances on your body afterward--such as powder,  lotion, or perfume--unless you are told to do so by your health care provider. Only use lotions that are recommended by the manufacturer. 9. Put on clean clothes or pajamas. 10. If it is the night before your surgery, sleep in clean sheets.   During a sponge bath Follow these steps when using CHG solution during a sponge bath (unless your health care provider gives you different instructions): 1. Use your normal soap and shampoo to wash your face and hair. 2. Pour the CHG onto a clean washcloth. 3. Starting at your neck, lather your body down to your toes. Make sure you follow these instructions: ? If you will be having surgery, pay special attention to the part of your body where you will be having surgery. Scrub this area for at least 1 minute. ? Do not use CHG on your head or face. If the solution gets into your ears or eyes, rinse them well with water. ? Avoid your genital area. ? Avoid any areas of skin that have broken skin, cuts, or scrapes. ? Scrub your back and under your arms. Make sure to wash skin folds. 4. Let the lather sit on your skin for 1-2 minutes or as long as told by your health care provider. 5. Using a different clean, wet washcloth, thoroughly rinse your entire body. Make sure that all body creases and crevices are rinsed well. 6. Dry off with a clean towel. Do not put any substances on your body afterward--such as powder, lotion, or perfume--unless you are told to do so by your health care provider. Only use lotions that are recommended by the manufacturer. 7. Put on clean clothes or pajamas. 8. If it is the night before your surgery, sleep in clean sheets. How to use CHG prepackaged cloths  Only use CHG cloths as told by your health care provider, and follow the instructions on the label.  Use the CHG cloth on clean, dry skin.  Do not use the CHG cloth on your head or face unless your health care provider tells you to.  When washing with the CHG cloth: ? Avoid  your genital area. ? Avoid any areas of skin that have broken skin, cuts, or scrapes. Before surgery Follow these steps when using a CHG cloth to clean before surgery (unless your health care provider gives you different instructions): 1. Using the CHG cloth, vigorously scrub the part of your body where you will be having surgery. Scrub using a back-and-forth motion for 3 minutes. The area on your body should be completely wet with CHG when you  are done scrubbing. 2. Do not rinse. Discard the cloth and let the area air-dry. Do not put any substances on the area afterward, such as powder, lotion, or perfume. 3. Put on clean clothes or pajamas. 4. If it is the night before your surgery, sleep in clean sheets.   For general bathing Follow these steps when using CHG cloths for general bathing (unless your health care provider gives you different instructions). 1. Use a separate CHG cloth for each area of your body. Make sure you wash between any folds of skin and between your fingers and toes. Wash your body in the following order, switching to a new cloth after each step: ? The front of your neck, shoulders, and chest. ? Both of your arms, under your arms, and your hands. ? Your stomach and groin area, avoiding the genitals. ? Your right leg and foot. ? Your left leg and foot. ? The back of your neck, your back, and your buttocks. 2. Do not rinse. Discard the cloth and let the area air-dry. Do not put any substances on your body afterward--such as powder, lotion, or perfume--unless you are told to do so by your health care provider. Only use lotions that are recommended by the manufacturer. 3. Put on clean clothes or pajamas. Contact a health care provider if:  Your skin gets irritated after scrubbing.  You have questions about using your solution or cloth. Get help right away if:  Your eyes become very red or swollen.  Your eyes itch badly.  Your skin itches badly and is red or  swollen.  Your hearing changes.  You have trouble seeing.  You have swelling or tingling in your mouth or throat.  You have trouble breathing.  You swallow any chlorhexidine. Summary  Chlorhexidine gluconate (CHG) is a germ-killing (antiseptic) solution that is used to clean the skin. Cleaning your skin with CHG may help to lower your risk for infection.  You may be given CHG to use for bathing. It may be in a bottle or in a prepackaged cloth to use on your skin. Carefully follow your health care provider's instructions and the instructions on the product label.  Do not use CHG if you have a chlorhexidine allergy.  Contact your health care provider if your skin gets irritated after scrubbing. This information is not intended to replace advice given to you by your health care provider. Make sure you discuss any questions you have with your health care provider. Document Revised: 06/23/2019 Document Reviewed: 06/23/2019 Elsevier Patient Education  2021 [place].   Partial Mastectomy, Care After This sheet gives you information about how to care for yourself after your procedure. Your health care provider may also give you more specific instructions. If you have problems or questions, contact your health care provider. What can I expect after the procedure? After the procedure, it is common to have:  Breast swelling.  Breast tenderness.  Stiffness in your arm or shoulder.  A change in the shape and feel of your breast.  Scar tissue that feels hard to the touch in the area where the lump was removed. Follow these instructions at home: Medicines  Take over-the-counter and prescription medicines only as told by your health care provider.  If you were prescribed an antibiotic medicine, take it as told by your health care provider. Do not stop taking the antibiotic even if you start to feel better.  Ask your health care provider if the medicine prescribed to  you: ?  Requires you to avoid driving or using heavy machinery. ? Can cause constipation. You may need to take these actions to prevent or treat constipation:  Drink enough fluid to keep your urine pale yellow.  Take over-the-counter or prescription medicines.  Eat foods that are high in fiber, such as beans, whole grains, and fresh fruits and vegetables.  Limit foods that are high in fat and processed sugars, such as fried or sweet foods. Incision care  Follow instructions from your health care provider about how to take care of your incision. Make sure you: ? Wash your hands with soap and water before and after you change your bandage (dressing). If soap and water are not available, use hand sanitizer. ? Change your dressing as told by your health care provider. ? Leave stitches (sutures), skin glue, or adhesive strips in place. These skin closures may need to stay in place for 2 weeks or longer. If adhesive strip edges start to loosen and curl up, you may trim the loose edges. Do not remove adhesive strips completely unless your health care provider tells you to do that.  Check your incision area every day for signs of infection. Check for: ? More redness, swelling, or pain. ? Fluid or blood. ? Warmth. ? Pus or a bad smell.  Keep your dressing clean and dry.  If you were sent home with a surgical drain in place, follow instructions from your health care provider about emptying it.      Bathing  Do not take baths, swim, or use a hot tub until your health care provider approves.  Ask your health care provider if you may take showers. You may only be allowed to take sponge baths. Activity  Rest as told by your health care provider.  Avoid sitting for a long time without moving. Get up to take short walks every 1-2 hours. This is important to improve blood flow and breathing. Ask for help if you feel weak or unsteady.  Return to your normal activities as told by your health care  provider. Ask your health care provider what activities are safe for you.  Be careful to avoid any activities that could cause an injury to your arm on the side of your surgery.  Do not lift anything that is heavier than 10 lb (4.5 kg), or the limit that you are told, until your health care provider says that it is safe. Avoid lifting with the arm that is on the side of your surgery.  Do not carry heavy objects on your shoulder on the side of your surgery.  Do exercises to keep your shoulder and arm from getting stiff and swollen. Talk with your health care provider about which exercises are safe for you. General instructions  Wear a supportive bra as told by your health care provider.  Raise (elevate) your arm above the level of your heart while you are sitting or lying down.  Do not wear tight jewelry on your arm, wrist, or fingers on the side of your surgery.  Keep all follow-up visits as told by your health care provider. This is important. ? You may need to be screened for extra fluid around the lymph nodes and swelling in the breast and arm (lymphedema). Follow instructions from your health care provider about how often you should be checked.  If you had any lymph nodes removed during your procedure, be sure to tell all of your health care providers. This is important information to share before  you are involved in certain procedures, such as having blood tests or having your blood pressure taken. Contact a health care provider if:  You develop a rash.  You have a fever.  Your pain medicine is not working.  You have swelling, weakness, or numbness in your arm that does not improve after a few weeks.  You have new swelling in your breast.  You have any of these signs of infection: ? More redness, swelling, or pain in your incision area. ? Fluid or blood coming from your incision. ? Warmth coming from the incision area. ? Pus or a bad smell coming from your incision. Get  help right away if you have:  Very bad pain in your breast or arm.  Swelling in your legs or arms.  Redness, warmth, or pain in your leg or arm.  Chest pain.  Difficulty breathing. Summary  After the procedure, it is common to have breast tenderness, swelling in your breast, and stiffness in your arm and shoulder.  Follow instructions from your health care provider about how to take care of your incision.  Do not lift anything that is heavier than 10 lb (4.5 kg), or the limit that you are told, until your health care provider says that it is safe. Avoid lifting with the arm that is on the side of your surgery.  If you had any lymph nodes removed during your procedure, be sure to tell all of your health care providers. This is important information to share before you are involved in certain procedures, such as having blood tests or having your blood pressure taken. This information is not intended to replace advice given to you by your health care provider. Make sure you discuss any questions you have with your health care provider. Document Revised: 07/11/2018 Document Reviewed: 07/11/2018 Elsevier Patient Education  2021 [place] Anesthesia, Adult, Care After This sheet gives you information about how to care for yourself after your procedure. Your health care provider may also give you more specific instructions. If you have problems or questions, contact your health care provider. What can I expect after the procedure? After the procedure, the following side effects are common:  Pain or discomfort at the IV site.  Nausea.  Vomiting.  Sore throat.  Trouble concentrating.  Feeling cold or chills.  Feeling weak or tired.  Sleepiness and fatigue.  Soreness and body aches. These side effects can affect parts of the body that were not involved in surgery. Follow these instructions at home: For the time period you were told by your health care  provider:  Rest.  Do not participate in activities where you could fall or become injured.  Do not drive or use machinery.  Do not drink alcohol.  Do not take sleeping pills or medicines that cause drowsiness.  Do not make important decisions or sign legal documents.  Do not take care of children on your own.   Eating and drinking  Follow any instructions from your health care provider about eating or drinking restrictions.  When you feel hungry, start by eating small amounts of foods that are soft and easy to digest (bland), such as toast. Gradually return to your regular diet.  Drink enough fluid to keep your urine pale yellow.  If you vomit, rehydrate by drinking water, juice, or clear broth. General instructions  If you have sleep apnea, surgery and certain medicines can increase your risk for breathing problems. Follow instructions from your health care  provider about wearing your sleep device: ? Anytime you are sleeping, including during daytime naps. ? While taking prescription pain medicines, sleeping medicines, or medicines that make you drowsy.  Have a responsible adult stay with you for the time you are told. It is important to have someone help care for you until you are awake and alert.  Return to your normal activities as told by your health care provider. Ask your health care provider what activities are safe for you.  Take over-the-counter and prescription medicines only as told by your health care provider.  If you smoke, do not smoke without supervision.  Keep all follow-up visits as told by your health care provider. This is important. Contact a health care provider if:  You have nausea or vomiting that does not get better with medicine.  You cannot eat or drink without vomiting.  You have pain that does not get better with medicine.  You are unable to pass urine.  You develop a skin rash.  You have a fever.  You have redness around your IV site  that gets worse. Get help right away if:  You have difficulty breathing.  You have chest pain.  You have blood in your urine or stool, or you vomit blood. Summary  After the procedure, it is common to have a sore throat or nausea. It is also common to feel tired.  Have a responsible adult stay with you for the time you are told. It is important to have someone help care for you until you are awake and alert.  When you feel hungry, start by eating small amounts of foods that are soft and easy to digest (bland), such as toast. Gradually return to your regular diet.  Drink enough fluid to keep your urine pale yellow.  Return to your normal activities as told by your health care provider. Ask your health care provider what activities are safe for you. This information is not intended to replace advice given to you by your health care provider. Make sure you discuss any questions you have with your health care provider. Document Revised: 09/21/2019 Document Reviewed: 04/20/2019 Elsevier Patient Education  2021 [employer].

## 2020-05-08 ENCOUNTER — Other Ambulatory Visit: Payer: Self-pay

## 2020-05-08 ENCOUNTER — Other Ambulatory Visit (HOSPITAL_COMMUNITY)
Admission: RE | Admit: 2020-05-08 | Discharge: 2020-05-08 | Disposition: A | Discharge status: Home / Self Care | Payer: Medicare Other | Source: Ambulatory Visit | Attending: General Surgery | Admitting: General Surgery

## 2020-05-08 ENCOUNTER — Encounter (HOSPITAL_COMMUNITY)
Admission: RE | Admit: 2020-05-08 | Discharge: 2020-05-08 | Disposition: A | Discharge status: Home / Self Care | Payer: Medicare Other | Source: Ambulatory Visit | Attending: General Surgery | Admitting: General Surgery

## 2020-05-08 ENCOUNTER — Encounter (HOSPITAL_COMMUNITY): Payer: Self-pay

## 2020-05-08 DIAGNOSIS — Z20822 Contact with and (suspected) exposure to covid-19: Secondary | ICD-10-CM | POA: Diagnosis not present

## 2020-05-08 DIAGNOSIS — Z01818 Encounter for other preprocedural examination: Secondary | ICD-10-CM | POA: Insufficient documentation

## 2020-05-08 HISTORY — DX: Chronic obstructive pulmonary disease, unspecified: J44.9

## 2020-05-08 HISTORY — DX: Chronic fatigue, unspecified: R53.82

## 2020-05-08 HISTORY — DX: Fibromyalgia: M79.7

## 2020-05-08 HISTORY — DX: Myalgic encephalomyelitis/chronic fatigue syndrome: G93.32

## 2020-05-08 HISTORY — DX: Other complications of anesthesia, initial encounter: T88.59XA

## 2020-05-08 HISTORY — DX: Age-related osteoporosis without current pathological fracture: M81.0

## 2020-05-08 LAB — CBC WITH DIFFERENTIAL/PLATELET
Abs Immature Granulocytes: 0.01 10*3/uL (ref 0.00–0.07)
Basophils Absolute: 0.1 10*3/uL (ref 0.0–0.1)
Basophils Relative: 1 %
Eosinophils Absolute: 0.1 10*3/uL (ref 0.0–0.5)
Eosinophils Relative: 2 %
HCT: 41.9 % (ref 36.0–46.0)
Hemoglobin: 12.8 g/dL (ref 12.0–15.0)
Immature Granulocytes: 0 %
Lymphocytes Relative: 52 %
Lymphs Abs: 2.9 10*3/uL (ref 0.7–4.0)
MCH: 30 pg (ref 26.0–34.0)
MCHC: 30.5 g/dL (ref 30.0–36.0)
MCV: 98.4 fL (ref 80.0–100.0)
Monocytes Absolute: 0.4 10*3/uL (ref 0.1–1.0)
Monocytes Relative: 8 %
Neutro Abs: 2.1 10*3/uL (ref 1.7–7.7)
Neutrophils Relative %: 37 %
Platelets: 268 10*3/uL (ref 150–400)
RBC: 4.26 MIL/uL (ref 3.87–5.11)
RDW: 12.6 % (ref 11.5–15.5)
WBC: 5.6 10*3/uL (ref 4.0–10.5)
nRBC: 0 % (ref 0.0–0.2)

## 2020-05-08 LAB — COMPREHENSIVE METABOLIC PANEL
ALT: 13 U/L (ref 0–44)
AST: 22 U/L (ref 15–41)
Albumin: 4.2 g/dL (ref 3.5–5.0)
Alkaline Phosphatase: 58 U/L (ref 38–126)
Anion gap: 9 (ref 5–15)
BUN: 9 mg/dL (ref 8–23)
CO2: 28 mmol/L (ref 22–32)
Calcium: 9.4 mg/dL (ref 8.9–10.3)
Chloride: 104 mmol/L (ref 98–111)
Creatinine, Ser: 0.68 mg/dL (ref 0.44–1.00)
GFR, Estimated: >60 mL/min (ref >60)
Glucose, Bld: 97 mg/dL (ref 70–99)
Potassium: 4.9 mmol/L (ref 3.5–5.1)
Sodium: 141 mmol/L (ref 135–145)
Total Bilirubin: 0.3 mg/dL (ref 0.3–1.2)
Total Protein: 7.1 g/dL (ref 6.5–8.1)

## 2020-05-09 LAB — SARS CORONAVIRUS 2 (TAT 6-24 HRS): SARS Coronavirus 2: NEGATIVE

## 2020-05-10 ENCOUNTER — Ambulatory Visit (HOSPITAL_COMMUNITY): Payer: Medicare Other | Admitting: Anesthesiology

## 2020-05-10 ENCOUNTER — Encounter (HOSPITAL_COMMUNITY)
Admission: RE | Disposition: A | Discharge status: Home / Self Care | Payer: Self-pay | Source: Home / Self Care | Attending: General Surgery

## 2020-05-10 ENCOUNTER — Ambulatory Visit (HOSPITAL_COMMUNITY)
Admission: RE | Admit: 2020-05-10 | Discharge: 2020-05-10 | Disposition: A | Discharge status: Home / Self Care | Payer: Medicare Other | Attending: General Surgery | Admitting: General Surgery

## 2020-05-10 ENCOUNTER — Encounter (HOSPITAL_COMMUNITY): Payer: Self-pay | Admitting: General Surgery

## 2020-05-10 DIAGNOSIS — C50912 Malignant neoplasm of unspecified site of left female breast: Secondary | ICD-10-CM | POA: Diagnosis present

## 2020-05-10 DIAGNOSIS — Z881 Allergy status to other antibiotic agents status: Secondary | ICD-10-CM | POA: Insufficient documentation

## 2020-05-10 DIAGNOSIS — F17210 Nicotine dependence, cigarettes, uncomplicated: Secondary | ICD-10-CM | POA: Diagnosis not present

## 2020-05-10 DIAGNOSIS — Z79899 Other long term (current) drug therapy: Secondary | ICD-10-CM | POA: Insufficient documentation

## 2020-05-10 HISTORY — PX: MASTECTOMY, PARTIAL: SHX709

## 2020-05-10 SURGERY — MASTECTOMY PARTIAL
Anesthesia: General | Site: Breast | Laterality: Left

## 2020-05-10 MED ORDER — PROPOFOL 10 MG/ML IV BOLUS
INTRAVENOUS | Status: DC | PRN
  Administered 2020-05-10: 150 mg via INTRAVENOUS

## 2020-05-10 MED ORDER — FENTANYL CITRATE (PF) 100 MCG/2ML IJ SOLN
INTRAMUSCULAR | Status: AC
  Filled 2020-05-10: qty 2

## 2020-05-10 MED ORDER — CHLORHEXIDINE GLUCONATE CLOTH 2 % EX PADS: 6.0000 | MEDICATED_PAD | Freq: Once | CUTANEOUS | Status: DC

## 2020-05-10 MED ORDER — CHLORHEXIDINE GLUCONATE 0.12 % MT SOLN
15.0000 mL | Freq: Once | OROMUCOSAL | Status: AC
  Administered 2020-05-10: 15 mL via OROMUCOSAL

## 2020-05-10 MED ORDER — LACTATED RINGERS IV SOLN: INTRAVENOUS | Status: DC

## 2020-05-10 MED ORDER — LIDOCAINE HCL (PF) 2 % IJ SOLN
INTRAMUSCULAR | Status: AC
  Filled 2020-05-10: qty 5

## 2020-05-10 MED ORDER — FENTANYL CITRATE (PF) 100 MCG/2ML IJ SOLN
25.0000 ug | INTRAMUSCULAR | Status: DC | PRN
  Administered 2020-05-10: 50 ug via INTRAVENOUS
  Filled 2020-05-10: qty 2

## 2020-05-10 MED ORDER — FENTANYL CITRATE (PF) 100 MCG/2ML IJ SOLN
INTRAMUSCULAR | Status: DC | PRN
  Administered 2020-05-10 (×2): 50 ug via INTRAVENOUS

## 2020-05-10 MED ORDER — LIDOCAINE HCL (CARDIAC) PF 100 MG/5ML IV SOSY
PREFILLED_SYRINGE | INTRAVENOUS | Status: DC | PRN
  Administered 2020-05-10: 60 mg via INTRAVENOUS

## 2020-05-10 MED ORDER — BUPIVACAINE HCL (PF) 0.5 % IJ SOLN
INTRAMUSCULAR | Status: AC
  Filled 2020-05-10: qty 30

## 2020-05-10 MED ORDER — MIDAZOLAM HCL 5 MG/5ML IJ SOLN
INTRAMUSCULAR | Status: DC | PRN
  Administered 2020-05-10: 1 mg via INTRAVENOUS

## 2020-05-10 MED ORDER — PHENYLEPHRINE 40 MCG/ML (10ML) SYRINGE FOR IV PUSH (FOR BLOOD PRESSURE SUPPORT)
PREFILLED_SYRINGE | INTRAVENOUS | Status: AC
  Filled 2020-05-10: qty 10

## 2020-05-10 MED ORDER — DEXAMETHASONE SODIUM PHOSPHATE 10 MG/ML IJ SOLN
INTRAMUSCULAR | Status: AC
  Filled 2020-05-10: qty 1

## 2020-05-10 MED ORDER — PHENYLEPHRINE 40 MCG/ML (10ML) SYRINGE FOR IV PUSH (FOR BLOOD PRESSURE SUPPORT)
PREFILLED_SYRINGE | INTRAVENOUS | Status: DC | PRN
Start: 2020-05-10 — End: 2020-05-10
  Administered 2020-05-10 (×2): 80 ug via INTRAVENOUS

## 2020-05-10 MED ORDER — DEXAMETHASONE SODIUM PHOSPHATE 4 MG/ML IJ SOLN
INTRAMUSCULAR | Status: DC | PRN
  Administered 2020-05-10: 6 mg via INTRAVENOUS

## 2020-05-10 MED ORDER — ONDANSETRON HCL 4 MG/2ML IJ SOLN: 4.0000 mg | Freq: Once | INTRAMUSCULAR | Status: DC | PRN

## 2020-05-10 MED ORDER — ONDANSETRON HCL 4 MG/2ML IJ SOLN
INTRAMUSCULAR | Status: DC | PRN
  Administered 2020-05-10: 4 mg via INTRAVENOUS

## 2020-05-10 MED ORDER — ONDANSETRON HCL 4 MG/2ML IJ SOLN
INTRAMUSCULAR | Status: AC
  Filled 2020-05-10: qty 2

## 2020-05-10 MED ORDER — MIDAZOLAM HCL 2 MG/2ML IJ SOLN
INTRAMUSCULAR | Status: AC
  Filled 2020-05-10: qty 2

## 2020-05-10 MED ORDER — ORAL CARE MOUTH RINSE: 15.0000 mL | Freq: Once | OROMUCOSAL | Status: AC

## 2020-05-10 MED ORDER — 0.9 % SODIUM CHLORIDE (POUR BTL) OPTIME
TOPICAL | Status: DC | PRN
  Administered 2020-05-10: 1000 mL

## 2020-05-10 MED ORDER — BUPIVACAINE HCL (PF) 0.5 % IJ SOLN
INTRAMUSCULAR | Status: DC | PRN
  Administered 2020-05-10: 5 mL

## 2020-05-10 SURGICAL SUPPLY — 32 items
ADH SKN CLS APL DERMABOND .7 (GAUZE/BANDAGES/DRESSINGS) ×1
APL PRP STRL LF DISP 70% ISPRP (MISCELLANEOUS) ×1
CHLORAPREP W/TINT 26 (MISCELLANEOUS) ×2 IMPLANT
CLOTH BEACON ORANGE TIMEOUT ST (SAFETY) ×2 IMPLANT
COVER LIGHT HANDLE STERIS (MISCELLANEOUS) ×4 IMPLANT
COVER WAND RF STERILE (DRAPES) ×2 IMPLANT
DECANTER SPIKE VIAL GLASS SM (MISCELLANEOUS) ×2 IMPLANT
DERMABOND ADVANCED (GAUZE/BANDAGES/DRESSINGS) ×1
DERMABOND ADVANCED .7 DNX12 (GAUZE/BANDAGES/DRESSINGS) ×1 IMPLANT
ELECT REM PT RETURN 9FT ADLT (ELECTROSURGICAL) ×2
ELECTRODE REM PT RTRN 9FT ADLT (ELECTROSURGICAL) ×1 IMPLANT
GAUZE SPONGE 4X4 12PLY STRL (GAUZE/BANDAGES/DRESSINGS) ×2 IMPLANT
GLOVE SURG SS PI 7.5 STRL IVOR (GLOVE) ×2 IMPLANT
GLOVE SURG UNDER POLY LF SZ7 (GLOVE) ×4 IMPLANT
GOWN STRL REUS W/TWL LRG LVL3 (GOWN DISPOSABLE) ×4 IMPLANT
KIT TURNOVER KIT A (KITS) ×2 IMPLANT
MANIFOLD NEPTUNE II (INSTRUMENTS) ×2 IMPLANT
NEEDLE HYPO 25X1 1.5 SAFETY (NEEDLE) ×2 IMPLANT
NS IRRIG 1000ML POUR BTL (IV SOLUTION) ×2 IMPLANT
PACK MINOR (CUSTOM PROCEDURE TRAY) ×2 IMPLANT
PAD ABD 5X9 TENDERSORB (GAUZE/BANDAGES/DRESSINGS) ×2 IMPLANT
PAD ARMBOARD 7.5X6 YLW CONV (MISCELLANEOUS) ×2 IMPLANT
PENCIL SMOKE EVACUATOR (MISCELLANEOUS) ×2 IMPLANT
SET BASIN LINEN APH (SET/KITS/TRAYS/PACK) ×2 IMPLANT
SPONGE LAP 18X18 RF (DISPOSABLE) ×2 IMPLANT
SUT MNCRL AB 4-0 PS2 18 (SUTURE) ×2 IMPLANT
SUT PROLENE 2 0 SH 30 (SUTURE) ×6 IMPLANT
SUT SILK 2 0 SH (SUTURE) ×2 IMPLANT
SUT VIC AB 3-0 SH 27 (SUTURE) ×2
SUT VIC AB 3-0 SH 27X BRD (SUTURE) ×1 IMPLANT
SYR CONTROL 10ML LL (SYRINGE) ×2 IMPLANT
TAPE PAPER 3X10 WHT MICROPORE (GAUZE/BANDAGES/DRESSINGS) ×2 IMPLANT

## 2020-05-10 NOTE — Anesthesia Postprocedure Evaluation (Signed)
Anesthesia Post Note  Patient: Virginia Blake  Procedure(s) Performed: MASTECTOMY PARTIAL (Left Breast)  Patient location during evaluation: Phase II Anesthesia Type: General Level of consciousness: awake Pain management: pain level controlled Vital Signs Assessment: post-procedure vital signs reviewed and stable Respiratory status: spontaneous breathing and respiratory function stable Cardiovascular status: blood pressure returned to baseline and stable Postop Assessment: no headache and no apparent nausea or vomiting Anesthetic complications: no Comments: Late entry   No complications documented.   Last Vitals:  Vitals:   05/10/20 0845 05/10/20 0902  BP: (!) 100/51 120/66  Pulse: 70 76  Resp: 11 16  Temp:  36.6 C  SpO2: 98% 96%    Last Pain:  Vitals:   05/10/20 0902  TempSrc: Oral  PainSc: [place]

## 2020-05-10 NOTE — Anesthesia Procedure Notes (Signed)
Procedure Name: LMA Insertion Date/Time: 05/10/2020 7:33 AM Performed by: Lieutenant Diego, CRNA Pre-anesthesia Checklist: Patient identified, Emergency Drugs available, Suction available and Patient being monitored Patient Re-evaluated:Patient Re-evaluated prior to induction Oxygen Delivery Method: Circle system utilized Preoxygenation: Pre-oxygenation with 100% oxygen Induction Type: IV induction Ventilation: Mask ventilation without difficulty LMA: LMA inserted LMA Size: 4.0 Number of attempts: 1 Placement Confirmation: positive ETCO2 and breath sounds checked- equal and bilateral Tube secured with: Tape Dental Injury: Teeth and Oropharynx as per pre-operative assessment

## 2020-05-10 NOTE — Interval H&P Note (Signed)
History and Physical Interval Note:  05/10/2020 7:15 AM  Virginia Blake  has presented today for surgery, with the diagnosis of recurrent left breast cancer.  The various methods of treatment have been discussed with the patient and family. After consideration of risks, benefits and other options for treatment, the patient has consented to  Procedure(s): MASTECTOMY PARTIAL (Left) as a surgical intervention.  The patient's history has been reviewed, patient examined, no change in status, stable for surgery.  I have reviewed the patient's chart and labs.  Questions were answered to the patient's satisfaction.     Aviva Signs

## 2020-05-10 NOTE — Transfer of Care (Signed)
Immediate Anesthesia Transfer of Care Note  Patient: Virginia Blake  Procedure(s) Performed: MASTECTOMY PARTIAL (Left Breast)  Patient Location: PACU  Anesthesia Type:General  Level of Consciousness: awake and alert   Airway & Oxygen Therapy: Patient Spontanous Breathing and Patient connected to nasal cannula oxygen  Post-op Assessment: Report given to RN and Post -op Vital signs reviewed and stable  Post vital signs: Reviewed and stable  Last Vitals:  Vitals Value Taken Time  BP 128/61 05/10/20 0804  Temp    Pulse 79 05/10/20 0805  Resp 21 05/10/20 0806  SpO2 100 % 05/10/20 0805  Vitals shown include unvalidated device data.  Last Pain:  Vitals:   05/10/20 0649  TempSrc: Oral      Patients Stated Pain Goal: 5 (29/79/89 2119)  Complications: No complications documented.

## 2020-05-10 NOTE — Op Note (Signed)
Patient:  Virginia Blake  DOB:  29-Sep-1950  MRN:  [id number]   Preop Diagnosis: Recurrent left breast cancer  Postop Diagnosis: Same  Procedure: Left partial mastectomy  Surgeon: Aviva Signs, MD  Anes: General  Indications: Patient is a 70 year old white female status post left partial mastectomy in January 2022 for recurrent left breast cancer who presents back for a wider excision due to unclear margins.  The risks and benefits of the procedure including bleeding, infection, and the possibility of unclear margins were fully explained to the patient, who gave informed consent.  Procedure note: The patient was placed in the supine position.  After general anesthesia was administered, the left mastectomy site was prepped and draped using the usual sterile technique with ChloraPrep.  Surgical site confirmation was performed.  A wide elliptical incision was made around the previous left partial mastectomy site.  Grossly it appeared that I included all the margins.  A full-thickness excision down to the chest wall was performed.  A suture was placed laterally for orientation purposes.  The specimen was sent to pathology for further examination.  A bleeding was controlled using Bovie electrocautery.  0.5% Sensorcaine was instilled into the surrounding wound.  The subcutaneous layer was reapproximated using a 3-0 Vicryl interrupted suture.  The skin was closed using 2-0 Prolene vertical mattress sutures.  Betadine ointment and dry sterile dressing were applied.  All tape and needle counts were correct at the end of the procedure.  The patient was awakened and transferred to PACU in stable condition.  Complications: None  EBL: Minimal  Specimen: Left breast tissue, suture lateral

## 2020-05-10 NOTE — Discharge Instructions (Signed)
Wound Care, Adult Taking care of your wound properly can help to prevent pain, infection, and scarring. It can also help your wound heal more quickly. Follow instructions from your health care provider about how to care for your wound. Supplies needed:  Soap and water.  Wound cleanser.  Gauze.  If needed, a clean bandage (dressing) or other type of wound dressing material to cover or place in the wound. Follow your health care provider's instructions about what dressing supplies to use.  Cream or ointment to apply to the wound, if told by your health care provider. How to care for your wound Cleaning the wound Ask your health care provider how to clean the wound. This may include:  Using mild soap and water or a wound cleanser.  Using a clean gauze to pat the wound dry after cleaning it. Do not rub or scrub the wound. Dressing care  Wash your hands with soap and water for at least 20 seconds before and after you change the dressing. If soap and water are not available, use hand sanitizer.  Change your dressing as told by your health care provider. This may include: ? Cleaning or rinsing out (irrigating) the wound. ? Placing a dressing over the wound or in the wound (packing). ? Covering the wound with an outer dressing.  Leave any stitches (sutures), skin glue, or adhesive strips in place. These skin closures may need to stay in place for 2 weeks or longer. If adhesive strip edges start to loosen and curl up, you may trim the loose edges. Do not remove adhesive strips completely unless your health care provider tells you to do that.  Ask your health care provider when you can leave the wound uncovered. Checking for infection Check your wound area every day for signs of infection. Check for:  More redness, swelling, or pain.  Fluid or blood.  Warmth.  Pus or a bad smell.   Follow these instructions at home Medicines  If you were prescribed an antibiotic medicine, cream, or  ointment, take or apply it as told by your health care provider. Do not stop using the antibiotic even if your condition improves.  If you were prescribed pain medicine, take it 30 minutes before you do any wound care or as told by your health care provider.  Take over-the-counter and prescription medicines only as told by your health care provider. Eating and drinking  Eat a diet that includes protein, vitamin A, vitamin C, and other nutrient-rich foods to help the wound heal. ? Foods rich in protein include meat, fish, eggs, dairy, beans, and nuts. ? Foods rich in vitamin A include carrots and dark green, leafy vegetables. ? Foods rich in vitamin C include citrus fruits, tomatoes, broccoli, and peppers.  Drink enough fluid to keep your urine pale yellow. General instructions  Do not take baths, swim, use a hot tub, or do anything that would put the wound underwater until your health care provider approves. Ask your health care provider if you may take showers. You may only be allowed to take sponge baths.  Do not scratch or pick at the wound. Keep it covered as told by your health care provider.  Return to your normal activities as told by your health care provider. Ask your health care provider what activities are safe for you.  Protect your wound from the sun when you are outside for the first 6 months, or for as long as told by your health care provider. Cover   up the scar area or apply sunscreen that has an SPF of at least 30. °· Do not use any products that contain nicotine or tobacco, such as cigarettes, e-cigarettes, and chewing tobacco. These may delay wound healing. If you need help quitting, ask your health care provider. °· Keep all follow-up visits as told by your health care provider. This is important. °Contact a health care provider if: °· You received a tetanus shot and you have swelling, severe pain, redness, or bleeding at the injection site. °· Your pain is not controlled  with medicine. °· You have any of these signs of infection: °? More redness, swelling, or pain around the wound. °? Fluid or blood coming from the wound. °? Warmth coming from the wound. °? Pus or a bad smell coming from the wound. °? A fever or chills. °· You are nauseous or you vomit. °· You are dizzy. °Get help right away if: °· You have a red streak of skin near the area around your wound. °· Your wound has been closed with staples, sutures, skin glue, or adhesive strips and it begins to open up and separate. °· Your wound is bleeding, and the bleeding does not stop with gentle pressure. °· You have a rash. °· You faint. °· You have trouble breathing. °These symptoms may represent a serious problem that is an emergency. Do not wait to see if the symptoms will go away. Get medical help right away. Call your local emergency services (911 in the [place] Do not drive yourself to the hospital. °Summary °· Always wash your hands with soap and water for at least 20 seconds before and after changing your dressing. °· Change your dressing as told by your health care provider. °· To help with healing, eat foods that are rich in protein, vitamin A, vitamin C, and other nutrients. °· Check your wound every day for signs of infection. Contact your health care provider if you suspect that your wound is infected. °This information is not intended to replace advice given to you by your health care provider. Make sure you discuss any questions you have with your health care provider. °Document Revised: 10/21/2018 Document Reviewed: 10/21/2018 °Elsevier Patient Education © 2021 [employer] ° ° ° ° ° ° ° ° ° ° ° ° °General Anesthesia, Adult, Care After °This sheet gives you information about how to care for yourself after your procedure. Your health care provider may also give you more specific instructions. If you have problems or questions, contact your health care provider. °What can I expect after the procedure? °After the  procedure, the following side effects are common: °· Pain or discomfort at the IV site. °· Nausea. °· Vomiting. °· Sore throat. °· Trouble concentrating. °· Feeling cold or chills. °· Feeling weak or tired. °· Sleepiness and fatigue. °· Soreness and body aches. These side effects can affect parts of the body that were not involved in surgery. °Follow these instructions at home: °For the time period you were told by your health care provider: °· Rest. °· Do not participate in activities where you could fall or become injured. °· Do not drive or use machinery. °· Do not drink alcohol. °· Do not take sleeping pills or medicines that cause drowsiness. °· Do not make important decisions or sign legal documents. °· Do not take care of children on your own.   °Eating and drinking °· Follow any instructions from your health care provider about eating or drinking restrictions. °· When   you feel hungry, start by eating small amounts of foods that are soft and easy to digest (bland), such as toast. Gradually return to your regular diet. °· Drink enough fluid to keep your urine pale yellow. °· If you vomit, rehydrate by drinking water, juice, or clear broth. °General instructions °· If you have sleep apnea, surgery and certain medicines can increase your risk for breathing problems. Follow instructions from your health care provider about wearing your sleep device: °? Anytime you are sleeping, including during daytime naps. °? While taking prescription pain medicines, sleeping medicines, or medicines that make you drowsy. °· Have a responsible adult stay with you for the time you are told. It is important to have someone help care for you until you are awake and alert. °· Return to your normal activities as told by your health care provider. Ask your health care provider what activities are safe for you. °· Take over-the-counter and prescription medicines only as told by your health care provider. °· If you smoke, do not smoke  without supervision. °· Keep all follow-up visits as told by your health care provider. This is important. °Contact a health care provider if: °· You have nausea or vomiting that does not get better with medicine. °· You cannot eat or drink without vomiting. °· You have pain that does not get better with medicine. °· You are unable to pass urine. °· You develop a skin rash. °· You have a fever. °· You have redness around your IV site that gets worse. °Get help right away if: °· You have difficulty breathing. °· You have chest pain. °· You have blood in your urine or stool, or you vomit blood. °Summary °· After the procedure, it is common to have a sore throat or nausea. It is also common to feel tired. °· Have a responsible adult stay with you for the time you are told. It is important to have someone help care for you until you are awake and alert. °· When you feel hungry, start by eating small amounts of foods that are soft and easy to digest (bland), such as toast. Gradually return to your regular diet. °· Drink enough fluid to keep your urine pale yellow. °· Return to your normal activities as told by your health care provider. Ask your health care provider what activities are safe for you. °This information is not intended to replace advice given to you by your health care provider. Make sure you discuss any questions you have with your health care provider. °Document Revised: 09/21/2019 Document Reviewed: 04/20/2019 °Elsevier Patient Education © 2021 [employer] Inc[employer] ° °

## 2020-05-10 NOTE — Anesthesia Preprocedure Evaluation (Signed)
Anesthesia Evaluation  Patient identified by MRN, date of birth, ID band Patient awake    Reviewed: Allergy & Precautions, H&P , NPO status , Patient's Chart, lab work & pertinent test results, reviewed documented beta blocker date and time   Airway Mallampati: II  TM Distance: >3 FB Neck ROM: full    Dental no notable dental hx. (+) Teeth Intact   Pulmonary COPD, Current Smoker,    Pulmonary exam normal breath sounds clear to auscultation       Cardiovascular Exercise Tolerance: Good negative cardio ROS   Rhythm:regular Rate:Normal     Neuro/Psych PSYCHIATRIC DISORDERS Anxiety  Neuromuscular disease    GI/Hepatic negative GI ROS, Neg liver ROS,   Endo/Other  negative endocrine ROS  Renal/GU negative Renal ROS  negative genitourinary   Musculoskeletal   Abdominal   Peds  Hematology negative hematology ROS (+)   Anesthesia Other Findings   Reproductive/Obstetrics negative OB ROS                             Anesthesia Physical Anesthesia Plan  ASA: II  Anesthesia Plan: General   Post-op Pain Management:    Induction:   PONV Risk Score and Plan: Propofol infusion and TIVA  Airway Management Planned:   Additional Equipment:   Intra-op Plan:   Post-operative Plan:   Informed Consent: I have reviewed the patients History and Physical, chart, labs and discussed the procedure including the risks, benefits and alternatives for the proposed anesthesia with the patient or authorized representative who has indicated his/her understanding and acceptance.     Dental Advisory Given  Plan Discussed with: CRNA  Anesthesia Plan Comments:         Anesthesia Quick Evaluation  

## 2020-05-13 ENCOUNTER — Encounter (HOSPITAL_COMMUNITY): Payer: Self-pay | Admitting: General Surgery

## 2020-05-14 LAB — SURGICAL PATHOLOGY

## 2020-05-21 ENCOUNTER — Ambulatory Visit: Payer: Medicare Other | Admitting: General Surgery

## 2020-05-23 ENCOUNTER — Encounter: Payer: Self-pay | Admitting: General Surgery

## 2020-05-23 ENCOUNTER — Other Ambulatory Visit: Payer: Self-pay

## 2020-05-23 ENCOUNTER — Ambulatory Visit (INDEPENDENT_AMBULATORY_CARE_PROVIDER_SITE_OTHER): Payer: Self-pay | Admitting: General Surgery

## 2020-05-23 VITALS — BP 144/66 | HR 75 | Temp 98.1°F | Resp 16 | Ht 59.0 in | Wt 134.0 lb

## 2020-05-23 DIAGNOSIS — Z09 Encounter for follow-up examination after completed treatment for conditions other than malignant neoplasm: Secondary | ICD-10-CM

## 2020-05-23 NOTE — Progress Notes (Signed)
Subjective:     Virginia Blake  Here for follow-up, status post reexcision of left breast cancer.  Patient has done well.  She has no complaints. Objective:    BP (!) 144/66   Pulse 75   Temp 98.1 F (36.7 C) (Other (Comment))   Resp 16   Ht 4\' 11"  (1.499 m)   Wt 134 lb (60.8 kg)   SpO2 90%   BMI 27.06 kg/m   General:  alert, cooperative and no distress  Left mastectomy incision healing well.  Sutures removed. Final pathology revealed cancer along the medial inferior aspect of the ellipse of skin removed.  Patient aware.     Assessment:    Recurrent left breast cancer.  Unclear inferomedial margin.    Plan:   Patient is following up with Dr. Delton Coombes of oncology next week to consider radiation therapy.  I told her that she may need further excision of the medial aspect of the current incision line.  Should she undergo radiation therapy, I would then delay any further surgery until after she has completed that therapy.  Await evaluation by Dr. Delton Coombes.  We will see patient in 1 month for follow-up.

## 2020-06-06 ENCOUNTER — Ambulatory Visit (HOSPITAL_COMMUNITY): Payer: Medicare Other | Admitting: Hematology

## 2020-06-12 NOTE — Progress Notes (Signed)
[facility] [address],  [address]   Patient Care Team: Neale Burly, MD as PCP - General (Internal Medicine) Brien Mates, RN as Oncology Nurse Navigator (Oncology)  SUMMARY OF ONCOLOGIC HISTORY: Oncology History   No history exists.    CHIEF COMPLIANT: Follow-up for left breast cancer   INTERVAL HISTORY: [name] is a 70 y.o. female here today for follow up of her left breast cancer. Her last visit was on 04/23/2020.   Today she reports feeling well. She reports that she had a margarita and vomited after consuming it; she reports that she does not typically consume alcohol. She has occasional pain in her right abdomen following this. She prefers surgery as the next course of action.   REVIEW OF SYSTEMS:   Review of Systems  Constitutional: Positive for fatigue (25%). Negative for appetite change.  Respiratory: Positive for shortness of breath.   All other systems reviewed and are negative.   I have reviewed the past medical history, past surgical history, social history and family history with the patient and they are unchanged from previous note.   ALLERGIES:   is allergic to ciprofloxacin, aspirin, doxycycline, and nsaids.   MEDICATIONS:  Current Outpatient Medications  Medication Sig Dispense Refill  . albuterol (VENTOLIN HFA) 108 (90 Base) MCG/ACT inhaler Inhale into the lungs every 4 (four) hours as needed for wheezing or shortness of breath.    . ALPRAZolam (XANAX) 1 MG tablet Take 1 mg by mouth 4 (four) times daily as needed for anxiety.  5  . ascorbic acid (VITAMIN C) 500 MG tablet Take 500 mg by mouth daily.    . cholecalciferol (VITAMIN D3) 25 MCG (1000 UNIT) tablet Take 1,000 Units by mouth in the morning and at bedtime.    Marland Kitchen HYDROcodone-acetaminophen (NORCO) 10-325 MG tablet Take 1 tablet by mouth every 4 (four) hours as needed for severe pain.  0  . ipratropium-albuterol (DUONEB) 0.5-2.5 (3) MG/3ML SOLN Inhale 3  mLs into the lungs every 6 (six) hours as needed (asthma).  11  . loperamide (IMODIUM A-D) 2 MG tablet Take 2 mg by mouth 4 (four) times daily as needed for diarrhea or loose stools.    . magnesium oxide (MAG-OX) 400 MG tablet Take 400 mg by mouth 2 (two) times daily.    Marland Kitchen OVER THE COUNTER MEDICATION Take 1 Scoop by mouth in the morning and at bedtime. Super Beets     No current facility-administered medications for this visit.     PHYSICAL EXAMINATION: Performance status (ECOG): 1 - Symptomatic but completely ambulatory  There were no vitals filed for this visit. Wt Readings from Last 3 Encounters:  05/23/20 134 lb (60.8 kg)  05/08/20 133 lb 9.6 oz (60.6 kg)  04/23/20 133 lb 9.6 oz (60.6 kg)   Physical Exam Vitals reviewed.  Constitutional:      Appearance: Normal appearance.  Cardiovascular:     Rate and Rhythm: Normal rate and regular rhythm.     Pulses: Normal pulses.     Heart sounds: Normal heart sounds.  Pulmonary:     Effort: Pulmonary effort is normal.     Breath sounds: Normal breath sounds.  Abdominal:     Palpations: Abdomen is soft. There is no hepatomegaly, splenomegaly or mass.     Tenderness: There is no abdominal tenderness.  Neurological:     General: No focal deficit present.     Mental Status: She is alert and  oriented to person, place, and time.  Psychiatric:        Mood and Affect: Mood normal.        Behavior: Behavior normal.     Breast Exam Chaperone: Thana Ates     LABORATORY DATA:  I have reviewed the data as listed CMP Latest Ref Rng & Units 05/08/2020 11/30/2019 11/10/2016  Glucose 70 - 99 mg/dL 97 97 87  BUN 8 - 23 mg/dL 9 7(L) 10  Creatinine 0.44 - 1.00 mg/dL 0.68 0.65 0.62  Sodium 135 - 145 mmol/L 141 136 140  Potassium 3.5 - 5.1 mmol/L 4.9 3.9 4.4  Chloride 98 - 111 mmol/L 104 98 101  CO2 22 - 32 mmol/L _0 Calcium 8.9 - 10.3 mg/dL 9.4 9.2 9.7  Total Protein 6.5 - 8.1 g/dL 7.1 7.5 7.3  Total Bilirubin 0.3 - 1.2 mg/dL 0.3  0.5 0.5  Alkaline Phos 38 - 126 U/L 58 60 71  AST 15 - 41 U/L _1 ALT 0 - 44 U/L _2 Lab Results  Component Value Date   CAN153 15.1 11/30/2019   Lab Results  Component Value Date   WBC 5.6 05/08/2020   HGB 12.8 05/08/2020   HCT 41.9 05/08/2020   MCV 98.4 05/08/2020   PLT 268 05/08/2020   NEUTROABS 2.1 05/08/2020    ASSESSMENT:  1.Recurrent left breast cancer: -Patient recently noticed a nodule in the left breast for the past several months. -As it was growing, she had mammogram right breast and ultrasound guided biopsy of the left breast on 11/06/2019 at [facility], [place]. -Pathology on 11/01/2019 consistent with grade 2 invasive ductal carcinoma, ER 95%, PR 0%, Ki-67 10%, HER-2 2+ by IHC, negative by FISH. -PET scan on 01/08/2020 with tiny soft tissue nodule in the medial aspect of the left mastectomy bed with low-level FDG accumulation compatible with biopsy-proven malignancy with no evidence of metastatic disease. Tiny lung nodules nonspecific.  2.  Left breast cancer (stage Ia): -Status post left mastectomy and sentinel lymph node biopsy on 02/05/2015 by Dr. Anthony Sar in [place]. -Pathology shows 6 mm, grade 1, positive lymphovascular invasion, single focus, margins negative, ER more than 90%, PR 10%, Ki-67 26%, HER-2 2+, negative by FISH. -She did not take antiestrogen therapy as she was afraid of the side effects. -Excision of the left breast nodule on 02/16/2020 by Dr. Arnoldo Morale. 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  Infiltrating ductal carcinoma involving superior and inferior margins and medial margin.  Tumor measures 1 x 0.8 x 0.8 cm.  ER was 95% positive, PR negative, HER-2 2+ and negative by FISH.  Ki-67 10%.  3. Social/family history: -Lives at home with ex-husband. She used to do office work. -Current active smoker 1-2 packs/day for 72 years. -Paternal uncle had cancer, type unknown to the  patient.  4. Osteoporosis: -Last bone density on 10/28/2016 shows T score -2.9. -She was not treated for osteoporosis. -DEXA scan on 11/20/2019 with T score -3.2.   PLAN:  1.Recurrent left breast cancer, ER positive, PR and HER-2 negative: - Reviewed pathology from 05/10/2020.  Reexcision of the left breast tissue shows grade 2 invasive ductal carcinoma. - Invasive carcinoma is present at the inferior medial margin focally and less than 1 mm from the deep margin focally. - We have talked about options including reexcision versus radiation therapy. - Patient is reluctant to consider radiation therapy. - We will send her  back to Dr. Arnoldo Morale for reexcision. - We talked about starting her on antiestrogen therapy with anastrozole. - We discussed the side effects in detail including but not limited to hot flashes, musculoskeletal symptoms, decreased bone mineral density. - Radiation therapy would would be an option if negative margins cannot be obtained by surgery. - Plan to reevaluate her in 3 months.  2. Osteoporosis: -We will plan to start her on Prolia after dental evaluation done. - She will continue calcium and vitamin D supplements.  Breast Cancer therapy associated bone loss: I have recommended calcium, Vitamin D and weight bearing exercises.  Orders placed this encounter:  No orders of the defined types were placed in this encounter.   The patient has a good understanding of the overall plan. She agrees with it. She will call with any problems that may develop before the next visit here.  Derek Jack, MD [place] [phone]   I, Thana Ates, am acting as a scribe for Dr. Derek Jack.  I, Derek Jack MD, have reviewed the above documentation for accuracy and completeness, and I agree with the above.

## 2020-06-13 ENCOUNTER — Encounter (HOSPITAL_COMMUNITY): Payer: Self-pay | Admitting: Hematology

## 2020-06-13 ENCOUNTER — Inpatient Hospital Stay (HOSPITAL_COMMUNITY): Payer: Medicare Other | Attending: Hematology | Admitting: Hematology

## 2020-06-13 ENCOUNTER — Other Ambulatory Visit: Payer: Self-pay

## 2020-06-13 VITALS — BP 129/59 | HR 97 | Temp 97.0°F | Resp 18 | Wt 132.4 lb

## 2020-06-13 DIAGNOSIS — C50912 Malignant neoplasm of unspecified site of left female breast: Secondary | ICD-10-CM | POA: Insufficient documentation

## 2020-06-13 DIAGNOSIS — Z170 Estrogen receptor positive status [ER+]: Secondary | ICD-10-CM | POA: Diagnosis not present

## 2020-06-13 DIAGNOSIS — C50412 Malignant neoplasm of upper-outer quadrant of left female breast: Secondary | ICD-10-CM | POA: Diagnosis not present

## 2020-06-13 DIAGNOSIS — M810 Age-related osteoporosis without current pathological fracture: Secondary | ICD-10-CM | POA: Insufficient documentation

## 2020-06-13 MED ORDER — ANASTROZOLE 1 MG PO TABS: 1.0000 mg | ORAL_TABLET | Freq: Every day | ORAL | 6 refills | Status: DC

## 2020-06-13 NOTE — Patient Instructions (Signed)
[facility] at [facility] Discharge Instructions  You were seen today by Dr. Katragadda. He went over your recent results. Dr. Katragadda will see you back in 3 months for labs and follow up.   Thank you for choosing [facility] at [facility] to provide your oncology and hematology care.  To afford each patient quality time with our provider, please arrive at least 15 minutes before your scheduled appointment time.   If you have a lab appointment with the Cancer Center please come in thru the Main Entrance and check in at the main information desk  You need to re-schedule your appointment should you arrive 10 or more minutes late.  We strive to give you quality time with our providers, and arriving late affects you and other patients whose appointments are after yours.  Also, if you no show three or more times for appointments you may be dismissed from the clinic at the providers discretion.     Again, thank you for choosing [facility].  Our hope is that these requests will decrease the amount of time that you wait before being seen by our physicians.       _____________________________________________________________  Should you have questions after your visit to  Cancer Center, please contact our office at [phone] between the hours of 8:00 a.m. and 4:30 p.m.  Voicemails left after 4:00 p.m. will not be returned until the following business day.  For prescription refill requests, have your pharmacy contact our office and allow 72 hours.    Cancer Center Support Programs:   > Cancer Support Group  2nd Tuesday of the month 1pm-2pm, Journey Room   

## 2020-06-20 ENCOUNTER — Encounter: Payer: Medicare Other | Admitting: General Surgery

## 2020-06-25 ENCOUNTER — Encounter: Payer: Self-pay | Admitting: General Surgery

## 2020-06-25 ENCOUNTER — Other Ambulatory Visit: Payer: Self-pay

## 2020-06-25 ENCOUNTER — Ambulatory Visit (INDEPENDENT_AMBULATORY_CARE_PROVIDER_SITE_OTHER): Payer: Medicare Other | Admitting: General Surgery

## 2020-06-25 VITALS — BP 152/69 | HR 93 | Temp 98.3°F | Resp 14 | Ht 59.0 in | Wt 135.0 lb

## 2020-06-25 DIAGNOSIS — Z09 Encounter for follow-up examination after completed treatment for conditions other than malignant neoplasm: Secondary | ICD-10-CM

## 2020-06-25 NOTE — Progress Notes (Signed)
Subjective:     Virginia Blake  Patient returns for scheduling of reexcision of her left breast cancer.  The last excision showed positive margins along the inferior/medial border.  She has seen Dr. Delton Coombes of oncology.  Both he and the patient would like reexcision instead of radiation therapy. Objective:    BP (!) 152/69   Pulse 93   Temp 98.3 F (36.8 C) (Other (Comment))   Resp 14   Ht 4\' 11"  (1.499 m)   Wt 135 lb (61.2 kg)   SpO2 92%   BMI 27.27 kg/m   General:  alert, cooperative and no distress  Left mastectomy surgical scar well-healed.  No nodules present.  Previous pathology reviewed     Assessment:    Left breast cancer, unclear margins    Plan:   Scheduled for left partial mastectomy on 07/19/2020.  The risks and benefits of the procedure including bleeding, infection, and microscopic positive margins were fully explained to the patient, who gave informed consent.

## 2020-07-07 NOTE — H&P (Signed)
Virginia Blake; [id number]; August 12, 1950     HPI Patient is a 70 year old white female status post a left modified radical mastectomy in 2017 by Dr. Anthony Sar who is referred to my care by Dr. Delton Coombes of oncology for a recurrent malignancy along the medial aspect of the surgical scar. This is biopsy-proven to be positive. She underwent a PET scan which shows no other metastatic lesions. She has been referred to my care for a wide excision of the recurrence. Patient states that has been present for approximately 1 year and has been increasing in size.  Patient underwent left partial mastectomy in January and March of this year.  Final pathology revealed positive surgical margins.     Past Medical History:  Diagnosis Date   Breast cancer ([place])      left mastectomy  2017   Family history of skin cancer     Hx of skin cancer, basal cell      Right posterior upper arm           Past Surgical History:  Procedure Laterality Date   ABDOMINAL HYSTERECTOMY   1998   INCONTINENCE SURGERY       TONSILLECTOMY       TOTAL MASTECTOMY Left     TUBAL LIGATION               Family History  Problem Relation Age of Onset   Skin cancer Brother     Skin cancer Paternal Uncle          nose   Heart attack Maternal Grandmother     Heart attack Maternal Grandfather     Stroke Paternal Grandfather     Skin cancer Brother     Cancer Paternal Uncle          unknown type            Current Outpatient Medications on File Prior to Visit  Medication Sig Dispense Refill   ALPRAZolam (XANAX) 1 MG tablet TK 1 T PO 5 TIMES PER DAY   5   amoxicillin-clavulanate (AUGMENTIN) 875-125 MG tablet SMARTSIG:1 Tablet(s) By Mouth Every 12 Hours       Ascorbic Acid (VITA-C PO) Take by mouth.       COPPER PO Take by mouth.       Multiple Vitamin (THERA) TABS Take by mouth.       Multiple Vitamins-Minerals (ZINC PO) Take by mouth.       N-ACETYL CYSTEINE PO Take by mouth.       NADH-ASCORBIC ACID-SOD BICARB PO Take by  mouth.       VITAMIN A PO Take by mouth.       VITAMIN E PO Take by mouth.       albuterol (VENTOLIN HFA) 108 (90 Base) MCG/ACT inhaler SMARTSIG:2 Puff(s) By Mouth Every 4 Hours (Patient not taking: No sig reported)       guaiFENesin (ROBITUSSIN) 100 MG/5ML liquid Take 10 mLs by mouth every 4 (four) hours as needed. (Patient not taking: No sig reported)       HYDROcodone-acetaminophen (NORCO) 10-325 MG tablet TAKE 1 TABLET BY MOUTH EVERY 4 HOURS (Patient not taking: No sig reported)   0   ipratropium-albuterol (DUONEB) 0.5-2.5 (3) MG/3ML SOLN U 3 ML VIA NEB QID (Patient not taking: No sig reported)   11   methylPREDNISolone (MEDROL DOSEPAK) 4 MG TBPK tablet Take by mouth as directed. (Patient not taking: No sig reported)        No current  facility-administered medications on file prior to visit.          Allergies  Allergen Reactions   Ciprofloxacin Anxiety   Doxycycline        Social History       Substance and Sexual Activity  Alcohol Use No      Social History        Tobacco Use  Smoking Status Current Every Day Smoker   Packs/day: 1.00   Types: Cigarettes  Smokeless Tobacco Never Used      Review of Systems Constitutional: Positive for malaise/fatigue. HENT: Negative.   Eyes: Positive for blurred vision and double vision. Respiratory: Positive for cough, shortness of breath and wheezing.   Cardiovascular: Negative.   Gastrointestinal: Positive for heartburn and nausea. Genitourinary: Positive for dysuria, frequency and urgency. Musculoskeletal: Positive for back pain, joint pain and neck pain. Skin: Negative.   Neurological: Negative.   Endo/Heme/Allergies: Negative.   Psychiatric/Behavioral: Negative.       Objective      Vitals:    02/01/20 0946  BP: 136/80  Pulse: 75  Resp: 16  Temp: 97.8 F (36.6 C)  SpO2: 93%      Physical Exam Vitals reviewed.  Constitutional:      Appearance: Normal appearance. She is normal weight. She is not  ill-appearing.  HENT:    Head: Normocephalic and atraumatic.  Cardiovascular:    Rate and Rhythm: Normal rate and regular rhythm.     Heart sounds: Normal heart sounds. No murmur heard. No friction rub. No gallop.   Pulmonary:    Effort: Pulmonary effort is normal. No respiratory distress.     Breath sounds: Normal breath sounds. No stridor. No wheezing, rhonchi or rales.  Skin:    General: Skin is warm and dry.  Neurological:    Mental Status: She is alert and oriented to person, place, and time.   Breast: Left mastectomy surgical scar well-healed   Oncology notes reviewed Assessment Recurrent left breast carcinoma, status post left partial mastectomy with positive surgical margins on pathology x 2. Plan   Patient is scheduled for a reexcision of the recurrent left breast carcinoma on 07/19/2020. The risks and benefits of the procedure including bleeding, infection, and the need for further excision given unclear margins were fully explained to the patient, who gave informed consent.

## 2020-07-17 ENCOUNTER — Other Ambulatory Visit: Payer: Self-pay

## 2020-07-17 ENCOUNTER — Ambulatory Visit (HOSPITAL_COMMUNITY)
Admission: RE | Admit: 2020-07-17 | Discharge: 2020-07-17 | Disposition: A | Discharge status: Home / Self Care | Payer: Medicare Other | Source: Ambulatory Visit | Attending: General Surgery | Admitting: General Surgery

## 2020-07-17 ENCOUNTER — Encounter (HOSPITAL_COMMUNITY)
Admission: RE | Admit: 2020-07-17 | Discharge: 2020-07-17 | Disposition: A | Discharge status: Home / Self Care | Payer: Medicare Other | Source: Ambulatory Visit | Attending: General Surgery | Admitting: General Surgery

## 2020-07-17 DIAGNOSIS — Z01812 Encounter for preprocedural laboratory examination: Secondary | ICD-10-CM | POA: Insufficient documentation

## 2020-07-17 DIAGNOSIS — D860 Sarcoidosis of lung: Secondary | ICD-10-CM | POA: Insufficient documentation

## 2020-07-17 LAB — CBC WITH DIFFERENTIAL/PLATELET
Abs Immature Granulocytes: 0.01 10*3/uL (ref 0.00–0.07)
Basophils Absolute: 0 10*3/uL (ref 0.0–0.1)
Basophils Relative: 1 %
Eosinophils Absolute: 0.1 10*3/uL (ref 0.0–0.5)
Eosinophils Relative: 2 %
HCT: 42.2 % (ref 36.0–46.0)
Hemoglobin: 13.4 g/dL (ref 12.0–15.0)
Immature Granulocytes: 0 %
Lymphocytes Relative: 35 %
Lymphs Abs: 2.2 10*3/uL (ref 0.7–4.0)
MCH: 30.4 pg (ref 26.0–34.0)
MCHC: 31.8 g/dL (ref 30.0–36.0)
MCV: 95.7 fL (ref 80.0–100.0)
Monocytes Absolute: 0.4 10*3/uL (ref 0.1–1.0)
Monocytes Relative: 7 %
Neutro Abs: 3.6 10*3/uL (ref 1.7–7.7)
Neutrophils Relative %: 55 %
Platelets: 279 10*3/uL (ref 150–400)
RBC: 4.41 MIL/uL (ref 3.87–5.11)
RDW: 12.4 % (ref 11.5–15.5)
WBC: 6.4 10*3/uL (ref 4.0–10.5)
nRBC: 0 % (ref 0.0–0.2)

## 2020-07-17 LAB — BASIC METABOLIC PANEL
Anion gap: 10 (ref 5–15)
BUN: 12 mg/dL (ref 8–23)
CO2: 31 mmol/L (ref 22–32)
Calcium: 9.6 mg/dL (ref 8.9–10.3)
Chloride: 99 mmol/L (ref 98–111)
Creatinine, Ser: 0.55 mg/dL (ref 0.44–1.00)
GFR, Estimated: >60 mL/min (ref >60)
Glucose, Bld: 95 mg/dL (ref 70–99)
Potassium: 4.6 mmol/L (ref 3.5–5.1)
Sodium: 140 mmol/L (ref 135–145)

## 2020-07-17 NOTE — Patient Instructions (Signed)
Virginia Blake  07/17/2020     @PREFPERIOPPHARMACY @   Your procedure is scheduled on 07/19/20.  Report to [facility] at [place] [facility]  Call this number if you have problems the morning of surgery:  [phone]   Remember:  Do not eat or drink after midnight.      Take these medicines the morning of surgery with A SIP OF WATER xanax, arimidex, norco if needed. You may also use your albuterol & duoneb nebulizer & bring your inhaler with you.    Do not wear jewelry, make-up or nail polish.  Do not wear lotions, powders, or perfumes, or deodorant.  Do not shave 48 hours prior to surgery.  Men may shave face and neck.  Do not bring valuables to the hospital.  [facility] is not responsible for any belongings or valuables.  Contacts, dentures or bridgework may not be worn into surgery.  Leave your suitcase in the car.  After surgery it may be brought to your room.  For patients admitted to the hospital, discharge time will be determined by your treatment team.  Patients discharged the day of surgery will not be allowed to drive home.     Please read over the following fact sheets that you were given. Coughing and Deep Breathing, Surgical Site Infection Prevention, Anesthesia Post-op Instructions, and Care and Recovery After Surgery        Skin Sparing Mastectomy, Care After This sheet gives you information about how to care for yourself after your procedure. Your health care provider may also give you more specific instructions. If you have problems or questions, contact your health careprovider. What can I expect after the procedure? After the procedure, it is common to have: Breast swelling. Breast tenderness. Stiffness in your arm or shoulder. A change in breast shape. A change in how the breast feels. Pain. Numbness or tingling. Follow these instructions at home: Incision care  Avoid wearing a bra following your procedure to allow the incision to heal. Your health care  provider will tell you when it is safe to wear a bra. Follow instructions from your health care provider about how to take care of your incision. Make sure you: Wash your hands with soap and water for at least 20 seconds before and after you change your bandage (dressing). If soap and water are not available, use hand sanitizer. Change your dressing as told by your health care provider. Keep your dressing dry. Leave stitches (sutures), skin glue, or adhesive strips in place. These skin closures may need to stay in place for 2 weeks or longer. If adhesive strip edges start to loosen and curl up, you may trim the loose edges. Do not remove adhesive strips completely unless your health care provider tells you to do that. Check your incision area and drain area every day for signs of infection. Check for: More redness, swelling, or pain. Fluid or blood. Warmth. Pus or a bad smell.  Medicines Take over-the-counter and prescription medicines only as told by your health care provider. Ask your health care provider if the medicine prescribed to you: Requires you to avoid driving or using machinery. Can cause constipation. You may need to take these actions to prevent or treat constipation: Drink enough fluid to keep your urine pale yellow. Take over-the-counter or prescription medicines. Eat foods that are high in fiber, such as beans, whole grains, and fresh fruits and vegetables. Limit foods that are high in fat and processed sugars, such as  fried or sweet foods. Activity  Return to your normal activities as told by your health care provider. Ask your health care provider what activities are safe for you. Avoid exercise that requires a lot of effort (is strenuous). Be careful to avoid any activities that could cause an injury to the arm that is on the side of your surgery. Do not lift anything that is heavier than 10 lb (4.5 kg). Avoid lifting with the arm that is on the side of your  surgery. Ask for help with child care, meal preparation, laundry, and shopping, if you are responsible for these things.  General instructions  Do not take baths, swim, or use a hot tub until your health care provider approves. Ask your health care provider if you may take showers. You may only be allowed to take sponge baths. Do not drive until your health care provider approves. If you have a drain, empty the fluid from the removable drain bulb as directed by your health care provider. Keep all follow-up visits as told by your health care provider. This is important.  Contact a health care provider if: You cannot eat or drink without vomiting. You have more redness, swelling, or pain around your incision or drain. You have fluid or blood coming from your incision or drain. Your incision or drain area feels warm to the touch. You have pus or a bad smell coming from your incision or drain. You develop a fever. Get help right away if you have: New or sudden chest pain. Chest pain or trouble breathing that gets worse. These symptoms may represent a serious problem that is an emergency. Do not wait to see if the symptoms will go away. Get medical help right away. Call your local emergency services (911 in the [place] Do not drive yourself to the hospital. Summary Check your incision area and drain area every day for signs of infection, such as redness, swelling, pain, fluid, blood, warmth, pus, or a bad smell. Contact your health care provider if you have a fever. Ask your health care provider if the medicine prescribed to you requires you to avoid driving or using machinery. Keep all follow-up visits as told by your health care provider. This is important. This information is not intended to replace advice given to you by your health care provider. Make sure you discuss any questions you have with your healthcare provider. Document Revised: 10/28/2018 Document Reviewed: 10/28/2018 Elsevier  Patient Education  [place] POST-ANESTHESIA  IMMEDIATELY FOLLOWING SURGERY:  Do not drive or operate machinery for the first twenty four hours after surgery.  Do not make any important decisions for twenty four hours after surgery or while taking narcotic pain medications or sedatives.  If you develop intractable nausea and vomiting or a severe headache please notify your doctor immediately.  FOLLOW-UP:  Please make an appointment with your surgeon as instructed. You do not need to follow up with anesthesia unless specifically instructed to do so.  WOUND CARE INSTRUCTIONS (if applicable):  Keep a dry clean dressing on the anesthesia/puncture wound site if there is drainage.  Once the wound has quit draining you may leave it open to air.  Generally you should leave the bandage intact for twenty four hours unless there is drainage.  If the epidural site drains for more than 36-48 hours please call the anesthesia department.  QUESTIONS?:  Please feel free to call your physician or the hospital operator if you have any questions, and  they will be happy to assist you.

## 2020-07-17 NOTE — Progress Notes (Signed)
Patient has been treated with antibiotic over the last 2 weeks for a sinus infection/lrespiratory infection that has left her feeling short of breath.  Sats are 92-93% on RA and patient appears to be in no acute distress at this time. Dr  Adalberto Ill notified and we will order a chest xray and follow up with Dr Woody Seller in the morning for follow up.

## 2020-07-19 ENCOUNTER — Ambulatory Visit (HOSPITAL_COMMUNITY): Payer: Medicare Other | Admitting: Anesthesiology

## 2020-07-19 ENCOUNTER — Encounter (HOSPITAL_COMMUNITY): Payer: Self-pay | Admitting: General Surgery

## 2020-07-19 ENCOUNTER — Ambulatory Visit (HOSPITAL_COMMUNITY)
Admission: RE | Admit: 2020-07-19 | Discharge: 2020-07-19 | Disposition: A | Discharge status: Home / Self Care | Payer: Medicare Other | Attending: General Surgery | Admitting: General Surgery

## 2020-07-19 ENCOUNTER — Encounter (HOSPITAL_COMMUNITY)
Admission: RE | Disposition: A | Discharge status: Home / Self Care | Payer: Self-pay | Source: Home / Self Care | Attending: General Surgery

## 2020-07-19 DIAGNOSIS — Z808 Family history of malignant neoplasm of other organs or systems: Secondary | ICD-10-CM | POA: Diagnosis not present

## 2020-07-19 DIAGNOSIS — Z881 Allergy status to other antibiotic agents status: Secondary | ICD-10-CM | POA: Diagnosis not present

## 2020-07-19 DIAGNOSIS — Z79899 Other long term (current) drug therapy: Secondary | ICD-10-CM | POA: Insufficient documentation

## 2020-07-19 DIAGNOSIS — Z85828 Personal history of other malignant neoplasm of skin: Secondary | ICD-10-CM | POA: Insufficient documentation

## 2020-07-19 DIAGNOSIS — C50912 Malignant neoplasm of unspecified site of left female breast: Secondary | ICD-10-CM

## 2020-07-19 DIAGNOSIS — F17210 Nicotine dependence, cigarettes, uncomplicated: Secondary | ICD-10-CM | POA: Insufficient documentation

## 2020-07-19 DIAGNOSIS — C50812 Malignant neoplasm of overlapping sites of left female breast: Secondary | ICD-10-CM | POA: Insufficient documentation

## 2020-07-19 DIAGNOSIS — Z9012 Acquired absence of left breast and nipple: Secondary | ICD-10-CM | POA: Diagnosis not present

## 2020-07-19 HISTORY — PX: MASTECTOMY, PARTIAL: SHX709

## 2020-07-19 SURGERY — MASTECTOMY PARTIAL
Anesthesia: General | Site: Breast | Laterality: Left

## 2020-07-19 MED ORDER — CHLORHEXIDINE GLUCONATE 0.12 % MT SOLN
15.0000 mL | Freq: Once | OROMUCOSAL | Status: AC
  Administered 2020-07-19: 15 mL via OROMUCOSAL

## 2020-07-19 MED ORDER — PROPOFOL 10 MG/ML IV BOLUS
INTRAVENOUS | Status: AC
  Filled 2020-07-19: qty 20

## 2020-07-19 MED ORDER — PROPOFOL 10 MG/ML IV BOLUS
INTRAVENOUS | Status: DC | PRN
  Administered 2020-07-19: 20 mg via INTRAVENOUS

## 2020-07-19 MED ORDER — ONDANSETRON HCL 4 MG/2ML IJ SOLN
INTRAMUSCULAR | Status: AC
  Filled 2020-07-19: qty 2

## 2020-07-19 MED ORDER — BUPIVACAINE LIPOSOME 1.3 % IJ SUSP
INTRAMUSCULAR | Status: AC
  Filled 2020-07-19: qty 10

## 2020-07-19 MED ORDER — DEXAMETHASONE SODIUM PHOSPHATE 10 MG/ML IJ SOLN
INTRAMUSCULAR | Status: AC
  Filled 2020-07-19: qty 1

## 2020-07-19 MED ORDER — IPRATROPIUM-ALBUTEROL 0.5-2.5 (3) MG/3ML IN SOLN
RESPIRATORY_TRACT | Status: AC
  Filled 2020-07-19: qty 3

## 2020-07-19 MED ORDER — IPRATROPIUM-ALBUTEROL 0.5-2.5 (3) MG/3ML IN SOLN
3.0000 mL | Freq: Four times a day (QID) | RESPIRATORY_TRACT | Status: DC | PRN
  Administered 2020-07-19: 3 mL via RESPIRATORY_TRACT

## 2020-07-19 MED ORDER — DEXMEDETOMIDINE (PRECEDEX) IN NS 20 MCG/5ML (4 MCG/ML) IV SYRINGE
PREFILLED_SYRINGE | INTRAVENOUS | Status: AC
  Filled 2020-07-19: qty 5

## 2020-07-19 MED ORDER — LIDOCAINE HCL 1 % IJ SOLN
INTRAMUSCULAR | Status: DC | PRN
  Administered 2020-07-19: 10 mL

## 2020-07-19 MED ORDER — CHLORHEXIDINE GLUCONATE 0.12 % MT SOLN
OROMUCOSAL | Status: AC
  Filled 2020-07-19: qty 15

## 2020-07-19 MED ORDER — POVIDONE-IODINE 10 % EX OINT
TOPICAL_OINTMENT | CUTANEOUS | Status: AC
  Filled 2020-07-19: qty 1

## 2020-07-19 MED ORDER — ONDANSETRON HCL 4 MG/2ML IJ SOLN: 4.0000 mg | Freq: Once | INTRAMUSCULAR | Status: DC | PRN

## 2020-07-19 MED ORDER — ORAL CARE MOUTH RINSE: 15.0000 mL | Freq: Once | OROMUCOSAL | Status: AC

## 2020-07-19 MED ORDER — CHLORHEXIDINE GLUCONATE CLOTH 2 % EX PADS: 6.0000 | MEDICATED_PAD | Freq: Once | CUTANEOUS | Status: DC

## 2020-07-19 MED ORDER — ONDANSETRON HCL 4 MG/2ML IJ SOLN
INTRAMUSCULAR | Status: DC | PRN
  Administered 2020-07-19: 4 mg via INTRAVENOUS

## 2020-07-19 MED ORDER — LACTATED RINGERS IV SOLN: INTRAVENOUS | Status: DC

## 2020-07-19 MED ORDER — FENTANYL CITRATE (PF) 100 MCG/2ML IJ SOLN
INTRAMUSCULAR | Status: AC
  Filled 2020-07-19: qty 2

## 2020-07-19 MED ORDER — POVIDONE-IODINE 10 % EX OINT
TOPICAL_OINTMENT | CUTANEOUS | Status: DC | PRN
  Administered 2020-07-19: 1 via TOPICAL

## 2020-07-19 MED ORDER — HYDROMORPHONE HCL 1 MG/ML IJ SOLN: 0.2500 mg | INTRAMUSCULAR | Status: DC | PRN

## 2020-07-19 MED ORDER — MIDAZOLAM HCL 2 MG/2ML IJ SOLN
INTRAMUSCULAR | Status: AC
  Filled 2020-07-19: qty 2

## 2020-07-19 MED ORDER — FENTANYL CITRATE (PF) 100 MCG/2ML IJ SOLN
INTRAMUSCULAR | Status: DC | PRN
  Administered 2020-07-19 (×2): 25 ug via INTRAVENOUS

## 2020-07-19 MED ORDER — 0.9 % SODIUM CHLORIDE (POUR BTL) OPTIME
TOPICAL | Status: DC | PRN
  Administered 2020-07-19: 1000 mL

## 2020-07-19 MED ORDER — DEXMEDETOMIDINE (PRECEDEX) IN NS 20 MCG/5ML (4 MCG/ML) IV SYRINGE
PREFILLED_SYRINGE | INTRAVENOUS | Status: DC | PRN
  Administered 2020-07-19 (×4): 4 ug via INTRAVENOUS

## 2020-07-19 MED ORDER — DEXAMETHASONE SODIUM PHOSPHATE 10 MG/ML IJ SOLN
INTRAMUSCULAR | Status: DC | PRN
  Administered 2020-07-19: 5 mg via INTRAVENOUS

## 2020-07-19 MED ORDER — MIDAZOLAM HCL 5 MG/5ML IJ SOLN
INTRAMUSCULAR | Status: DC | PRN
  Administered 2020-07-19: 1 mg via INTRAVENOUS

## 2020-07-19 MED ORDER — BUPIVACAINE LIPOSOME 1.3 % IJ SUSP
INTRAMUSCULAR | Status: AC
  Filled 2020-07-19: qty 20

## 2020-07-19 MED ORDER — PROPOFOL 500 MG/50ML IV EMUL
INTRAVENOUS | Status: DC | PRN
  Administered 2020-07-19: 125 ug/kg/min via INTRAVENOUS

## 2020-07-19 MED ORDER — LIDOCAINE HCL (PF) 1 % IJ SOLN
INTRAMUSCULAR | Status: AC
  Filled 2020-07-19: qty 30

## 2020-07-19 MED ORDER — LIDOCAINE HCL (CARDIAC) PF 100 MG/5ML IV SOSY
PREFILLED_SYRINGE | INTRAVENOUS | Status: DC | PRN
  Administered 2020-07-19: 50 mg via INTRAVENOUS

## 2020-07-19 MED ORDER — BUPIVACAINE LIPOSOME 1.3 % IJ SUSP: INTRAMUSCULAR | Status: DC | PRN

## 2020-07-19 SURGICAL SUPPLY — 30 items
APPLICATOR CHLORAPREP 10.5 ORG (MISCELLANEOUS) ×4 IMPLANT
CLOTH BEACON ORANGE TIMEOUT ST (SAFETY) ×2 IMPLANT
COVER LIGHT HANDLE STERIS (MISCELLANEOUS) ×4 IMPLANT
DRSG TEGADERM 4X4.75 (GAUZE/BANDAGES/DRESSINGS) ×2 IMPLANT
ELECT NEEDLE TIP 2.8 STRL (NEEDLE) ×2 IMPLANT
ELECT REM PT RETURN 9FT ADLT (ELECTROSURGICAL) ×2
ELECTRODE REM PT RTRN 9FT ADLT (ELECTROSURGICAL) ×1 IMPLANT
GAUZE 4X4 16PLY ~~LOC~~+RFID DBL (SPONGE) ×2 IMPLANT
GLOVE SRG 8 PF TXTR STRL LF DI (GLOVE) ×1 IMPLANT
GLOVE SURG POLYISO LF SZ8 (GLOVE) ×2 IMPLANT
GLOVE SURG SS PI 7.5 STRL IVOR (GLOVE) ×2 IMPLANT
GLOVE SURG UNDER POLY LF SZ7 (GLOVE) ×8 IMPLANT
GLOVE SURG UNDER POLY LF SZ8 (GLOVE) ×1
GOWN STRL REUS W/TWL LRG LVL3 (GOWN DISPOSABLE) ×4 IMPLANT
KIT TURNOVER KIT A (KITS) ×2 IMPLANT
MANIFOLD NEPTUNE II (INSTRUMENTS) ×2 IMPLANT
NEEDLE HYPO 25X1 1.5 SAFETY (NEEDLE) ×2 IMPLANT
NS IRRIG 1000ML POUR BTL (IV SOLUTION) ×2 IMPLANT
PACK MINOR (CUSTOM PROCEDURE TRAY) ×2 IMPLANT
PAD ARMBOARD 7.5X6 YLW CONV (MISCELLANEOUS) ×2 IMPLANT
PENCIL SMOKE EVACUATOR (MISCELLANEOUS) ×2 IMPLANT
SET BASIN LINEN APH (SET/KITS/TRAYS/PACK) ×2 IMPLANT
SPONGE GAUZE 2X2 8PLY STRL LF (GAUZE/BANDAGES/DRESSINGS) ×4 IMPLANT
SPONGE T-LAP 18X18 ~~LOC~~+RFID (SPONGE) ×2 IMPLANT
SUT MNCRL AB 4-0 PS2 18 (SUTURE) ×2 IMPLANT
SUT PROLENE NAB BLUE 3-0 30IN (SUTURE) ×2 IMPLANT
SUT SILK 2 0 SH (SUTURE) ×2 IMPLANT
SUT VIC AB 3-0 SH 27 (SUTURE) ×1
SUT VIC AB 3-0 SH 27X BRD (SUTURE) ×1 IMPLANT
SYR CONTROL 10ML LL (SYRINGE) ×2 IMPLANT

## 2020-07-19 NOTE — Anesthesia Preprocedure Evaluation (Addendum)
Anesthesia Evaluation  Patient identified by MRN, date of birth, ID band Patient awake    Reviewed: Allergy & Precautions, NPO status , Patient's Chart, lab work & pertinent test results  History of Anesthesia Complications (+) PROLONGED EMERGENCE and history of anesthetic complications  Airway Mallampati: II  TM Distance: >3 FB Neck ROM: Full    Dental  (+) Dental Advisory Given, Missing, Chipped   Pulmonary COPD,  COPD inhaler, Current Smoker and Patient abstained from smoking.,  Room air saturations 92- 95    + decreased breath sounds+ wheezing      Cardiovascular hypertension, Pt. on medications Normal cardiovascular exam Rhythm:Regular Rate:Normal  08-May-2020 14:49:17 [facility] ROUTINE RECORD Oct 04, 1950 (32 yr) Female Caucasian Room: Loc:905 Technician: Test ind: Vent. rate 89 BPM PR interval 122 ms QRS duration 88 ms QT/QTcB 354/430 ms P-R-T axes 85 82 80 Normal sinus rhythm Normal ECG Confirmed by Asencion Noble [phone]) on 05/08/2020 10:06:40 PM   Neuro/Psych Anxiety  Neuromuscular disease    GI/Hepatic negative GI ROS, Neg liver ROS,   Endo/Other  negative endocrine ROS  Renal/GU negative Renal ROS     Musculoskeletal  (+) Fibromyalgia -  Abdominal   Peds  Hematology negative hematology ROS (+)   Anesthesia Other Findings Left breast cancer  Reproductive/Obstetrics negative OB ROS                          Anesthesia Physical Anesthesia Plan  ASA: 3  Anesthesia Plan: General   Post-op Pain Management:    Induction: Intravenous  PONV Risk Score and Plan: 3 and Ondansetron, Dexamethasone and Midazolam  Airway Management Planned: LMA  Additional Equipment:   Intra-op Plan:   Post-operative Plan: Extubation in OR  Informed Consent: I have reviewed the patients History and Physical, chart, labs and discussed the procedure including the risks,  benefits and alternatives for the proposed anesthesia with the patient or authorized representative who has indicated his/her understanding and acceptance.     Dental advisory given  Plan Discussed with: CRNA and Surgeon  Anesthesia Plan Comments:       Anesthesia Quick Evaluation

## 2020-07-19 NOTE — Transfer of Care (Signed)
Immediate Anesthesia Transfer of Care Note  Patient: Virginia Blake  Procedure(s) Performed: MASTECTOMY PARTIAL (Left: Breast)  Patient Location: PACU  Anesthesia Type:MAC  Level of Consciousness: awake, alert  and oriented  Airway & Oxygen Therapy: Patient Spontanous Breathing  Post-op Assessment: Report given to RN and Post -op Vital signs reviewed and stable  Post vital signs: Reviewed and stable  Last Vitals:  Vitals Value Taken Time  BP    Temp    Pulse 84 07/19/20 1025  Resp 20 07/19/20 1025  SpO2 99 % 07/19/20 1025  Vitals shown include unvalidated device data.  Last Pain:  Vitals:   07/19/20 0719  TempSrc: Oral  PainSc: 3       Patients Stated Pain Goal: 2 (50/03/70 4888)  Complications: No notable events documented.

## 2020-07-19 NOTE — Op Note (Signed)
Patient:  Virginia Blake  DOB:  Sep 24, 1950  MRN:  [id number]   Preop Diagnosis: Recurrent left breast cancer  Postop Diagnosis: Same  Procedure: Left breast biopsy  Surgeon: Aviva Signs, MD  Anes: MAC  Indications: Patient is a 70 year old white female status post left modified radical mastectomy this past who has had recurrence along the skin of the mastectomy site.  She has undergone previous excision of the skin recurrence, but the last biopsy revealed unclear margins along the inferior medial aspect.  Patient now comes back to the operating room for reexcision.  The risks and benefits of the procedure including bleeding, infection, and recurrence of the breast cancer were fully explained to the patient, who gave informed consent.  Procedure note: The patient was placed in the supine position after monitored anesthesia was given.  The left chest wall was prepped and draped using the usual sterile technique with ChloraPrep.  Surgical site confirmation was performed.  1% Xylocaine was used for local anesthesia.  An elliptical incision was made around the previous surgical incision site.  A short suture was placed superiorly and a long suture placed laterally for orientation purposes.  Full-thickness skin excision was performed.  The specimen was sent to pathology for further examination.  A bleeding was controlled using Bovie electrocautery.  The subcutaneous layer was reapproximated using 3-0 Vicryl interrupted sutures.  The skin was reapproximated using 3-0 Prolene vertical mattress sutures.  Betadine ointment and a dry sterile dressing was applied.  All tape and needle counts were correct at the end of the procedure.  The patient was awakened and transferred to PACU in stable condition.  Complications: None  EBL: Minimal  Specimen: Left breast biopsy, short suture superior, long suture lateral

## 2020-07-19 NOTE — Interval H&P Note (Signed)
History and Physical Interval Note:  07/19/2020 7:04 AM  Virginia Blake  has presented today for surgery, with the diagnosis of Left breast cancer.  The various methods of treatment have been discussed with the patient and family. After consideration of risks, benefits and other options for treatment, the patient has consented to  Procedure(s): MASTECTOMY PARTIAL (Left) as a surgical intervention.  The patient's history has been reviewed, patient examined, no change in status, stable for surgery.  I have reviewed the patient's chart and labs.  Questions were answered to the patient's satisfaction.     Aviva Signs

## 2020-07-19 NOTE — Anesthesia Postprocedure Evaluation (Signed)
Anesthesia Post Note  Patient: Virginia Blake  Procedure(s) Performed: MASTECTOMY PARTIAL (Left: Breast)  Patient location during evaluation: PACU Anesthesia Type: General Level of consciousness: awake and alert and oriented Pain management: pain level controlled Vital Signs Assessment: post-procedure vital signs reviewed and stable Respiratory status: spontaneous breathing and respiratory function stable Cardiovascular status: blood pressure returned to baseline and stable Postop Assessment: no apparent nausea or vomiting Anesthetic complications: no   No notable events documented.   Last Vitals:  Vitals:   07/19/20 1115 07/19/20 1133  BP: (!) 95/44 131/62  Pulse: 76   Resp: (!) 21   Temp:  36.4 C  SpO2: 92% 93%    Last Pain:  Vitals:   07/19/20 1133  TempSrc: Oral  PainSc: 4                  Sadeel Fiddler C Ayomide Purdy

## 2020-07-23 ENCOUNTER — Encounter (HOSPITAL_COMMUNITY): Payer: Self-pay | Admitting: General Surgery

## 2020-07-23 LAB — SURGICAL PATHOLOGY

## 2020-08-01 ENCOUNTER — Encounter: Payer: Self-pay | Admitting: General Surgery

## 2020-08-01 ENCOUNTER — Other Ambulatory Visit: Payer: Self-pay

## 2020-08-01 ENCOUNTER — Ambulatory Visit (INDEPENDENT_AMBULATORY_CARE_PROVIDER_SITE_OTHER): Payer: Medicare Other | Admitting: General Surgery

## 2020-08-01 VITALS — BP 158/73 | HR 102 | Temp 98.1°F | Resp 18 | Ht 59.0 in | Wt 131.0 lb

## 2020-08-01 DIAGNOSIS — Z09 Encounter for follow-up examination after completed treatment for conditions other than malignant neoplasm: Secondary | ICD-10-CM

## 2020-08-01 NOTE — Progress Notes (Signed)
Subjective:     Virginia Blake  Here for postoperative visit, status post reexcision of left mastectomy breast cancer recurrence.  Patient doing well.  She has no complaints. Objective:    BP (!) 158/73   Pulse (!) 102   Temp 98.1 F (36.7 C) (Other (Comment))   Resp 18   Ht 4\' 11"  (1.499 m)   Wt 131 lb (59.4 kg)   SpO2 92%   BMI 26.46 kg/m   General:  alert, cooperative, and no distress  Incision healing well.  Sutures removed. Final pathology showed no residual cancer.     Assessment:    Doing well postoperatively.    Plan:   Follow-up here as needed.

## 2020-09-09 ENCOUNTER — Inpatient Hospital Stay (HOSPITAL_COMMUNITY): Payer: Medicare Other

## 2020-09-16 ENCOUNTER — Ambulatory Visit (HOSPITAL_COMMUNITY): Payer: Medicare Other | Admitting: Hematology

## 2020-09-20 ENCOUNTER — Inpatient Hospital Stay (HOSPITAL_COMMUNITY): Payer: Medicare Other

## 2020-09-27 ENCOUNTER — Inpatient Hospital Stay (HOSPITAL_COMMUNITY): Payer: Medicare Other | Attending: Hematology

## 2020-09-30 ENCOUNTER — Inpatient Hospital Stay (HOSPITAL_COMMUNITY): Payer: Medicare Other | Admitting: Hematology

## 2021-01-30 DIAGNOSIS — M5416 Radiculopathy, lumbar region: Secondary | ICD-10-CM | POA: Diagnosis not present

## 2021-01-30 DIAGNOSIS — G894 Chronic pain syndrome: Secondary | ICD-10-CM | POA: Diagnosis not present

## 2021-01-30 DIAGNOSIS — M461 Sacroiliitis, not elsewhere classified: Secondary | ICD-10-CM | POA: Diagnosis not present

## 2021-02-19 ENCOUNTER — Inpatient Hospital Stay (HOSPITAL_COMMUNITY): Payer: Medicare PPO | Attending: Hematology

## 2021-02-19 ENCOUNTER — Other Ambulatory Visit: Payer: Self-pay

## 2021-02-19 DIAGNOSIS — C50912 Malignant neoplasm of unspecified site of left female breast: Secondary | ICD-10-CM | POA: Insufficient documentation

## 2021-02-19 DIAGNOSIS — F17210 Nicotine dependence, cigarettes, uncomplicated: Secondary | ICD-10-CM | POA: Diagnosis not present

## 2021-02-19 DIAGNOSIS — Z79810 Long term (current) use of selective estrogen receptor modulators (SERMs): Secondary | ICD-10-CM | POA: Diagnosis not present

## 2021-02-19 DIAGNOSIS — Z170 Estrogen receptor positive status [ER+]: Secondary | ICD-10-CM | POA: Insufficient documentation

## 2021-02-19 DIAGNOSIS — C50412 Malignant neoplasm of upper-outer quadrant of left female breast: Secondary | ICD-10-CM

## 2021-02-19 LAB — COMPREHENSIVE METABOLIC PANEL
ALT: 12 U/L (ref 0–44)
AST: 24 U/L (ref 15–41)
Albumin: 4.5 g/dL (ref 3.5–5.0)
Alkaline Phosphatase: 58 U/L (ref 38–126)
Anion gap: 5 (ref 5–15)
BUN: 10 mg/dL (ref 8–23)
CO2: 36 mmol/L — ABNORMAL HIGH (ref 22–32)
Calcium: 9.2 mg/dL (ref 8.9–10.3)
Chloride: 95 mmol/L — ABNORMAL LOW (ref 98–111)
Creatinine, Ser: 0.63 mg/dL (ref 0.44–1.00)
GFR, Estimated: >60 mL/min (ref >60)
Glucose, Bld: 113 mg/dL — ABNORMAL HIGH (ref 70–99)
Potassium: 4.2 mmol/L (ref 3.5–5.1)
Sodium: 136 mmol/L (ref 135–145)
Total Bilirubin: 0.4 mg/dL (ref 0.3–1.2)
Total Protein: 7.5 g/dL (ref 6.5–8.1)

## 2021-02-19 LAB — CBC WITH DIFFERENTIAL/PLATELET
Abs Immature Granulocytes: 0.02 10*3/uL (ref 0.00–0.07)
Basophils Absolute: 0 10*3/uL (ref 0.0–0.1)
Basophils Relative: 1 %
Eosinophils Absolute: 0.1 10*3/uL (ref 0.0–0.5)
Eosinophils Relative: 2 %
HCT: 49.7 % — ABNORMAL HIGH (ref 36.0–46.0)
Hemoglobin: 15.4 g/dL — ABNORMAL HIGH (ref 12.0–15.0)
Immature Granulocytes: 0 %
Lymphocytes Relative: 52 %
Lymphs Abs: 3.5 10*3/uL (ref 0.7–4.0)
MCH: 30.8 pg (ref 26.0–34.0)
MCHC: 31 g/dL (ref 30.0–36.0)
MCV: 99.4 fL (ref 80.0–100.0)
Monocytes Absolute: 0.4 10*3/uL (ref 0.1–1.0)
Monocytes Relative: 6 %
Neutro Abs: 2.6 10*3/uL (ref 1.7–7.7)
Neutrophils Relative %: 39 %
Platelets: 277 10*3/uL (ref 150–400)
RBC: 5 MIL/uL (ref 3.87–5.11)
RDW: 12.6 % (ref 11.5–15.5)
WBC: 6.7 10*3/uL (ref 4.0–10.5)
nRBC: 0 % (ref 0.0–0.2)

## 2021-02-26 ENCOUNTER — Inpatient Hospital Stay (HOSPITAL_BASED_OUTPATIENT_CLINIC_OR_DEPARTMENT_OTHER): Payer: Medicare PPO | Admitting: Hematology

## 2021-02-26 ENCOUNTER — Other Ambulatory Visit: Payer: Self-pay

## 2021-02-26 ENCOUNTER — Other Ambulatory Visit (HOSPITAL_COMMUNITY): Payer: Self-pay | Admitting: Hematology

## 2021-02-26 ENCOUNTER — Other Ambulatory Visit (HOSPITAL_COMMUNITY): Payer: Self-pay | Admitting: *Deleted

## 2021-02-26 VITALS — BP 139/62 | HR 95 | Temp 98.1°F | Resp 18 | Wt 126.4 lb

## 2021-02-26 DIAGNOSIS — C50412 Malignant neoplasm of upper-outer quadrant of left female breast: Secondary | ICD-10-CM

## 2021-02-26 DIAGNOSIS — Z122 Encounter for screening for malignant neoplasm of respiratory organs: Secondary | ICD-10-CM

## 2021-02-26 DIAGNOSIS — F17210 Nicotine dependence, cigarettes, uncomplicated: Secondary | ICD-10-CM | POA: Diagnosis not present

## 2021-02-26 DIAGNOSIS — Z79810 Long term (current) use of selective estrogen receptor modulators (SERMs): Secondary | ICD-10-CM | POA: Diagnosis not present

## 2021-02-26 DIAGNOSIS — C50912 Malignant neoplasm of unspecified site of left female breast: Secondary | ICD-10-CM | POA: Diagnosis not present

## 2021-02-26 DIAGNOSIS — Z170 Estrogen receptor positive status [ER+]: Secondary | ICD-10-CM | POA: Diagnosis not present

## 2021-02-26 DIAGNOSIS — Z1231 Encounter for screening mammogram for malignant neoplasm of breast: Secondary | ICD-10-CM

## 2021-02-26 MED ORDER — TAMOXIFEN CITRATE 20 MG PO TABS: 20.0000 mg | ORAL_TABLET | Freq: Every day | ORAL | 0 refills | Status: DC

## 2021-02-26 NOTE — Progress Notes (Signed)
[facility] [address], [place] [address]   Patient Care Team: Neale Burly, MD as PCP - General (Internal Medicine) Brien Mates, RN as Oncology Nurse Navigator (Oncology)  SUMMARY OF ONCOLOGIC HISTORY: Oncology History   No history exists.    CHIEF COMPLIANT: Follow-up for left breast cancer   INTERVAL HISTORY: [name] is a 71 y.o. female here today for follow up of her left breast cancer. Her last visit was on 06/13/2020.   Today she reports feeling fair. She stopped taking anastrozole after 1.5 weeks due to full body bone pains. She currently smokes 1 ppd and has smoked for 40 years. She denies hot flashes.   REVIEW OF SYSTEMS:   Review of Systems  Constitutional:  Positive for appetite change and fatigue.  HENT:   Positive for tinnitus and trouble swallowing.   Respiratory:  Positive for shortness of breath.   Gastrointestinal:  Positive for diarrhea.  Endocrine: Negative for hot flashes.  Musculoskeletal:  Positive for arthralgias (3-4 R hip).  All other systems reviewed and are negative.  I have reviewed the past medical history, past surgical history, social history and family history with the patient and they are unchanged from previous note.   ALLERGIES:   is allergic to ciprofloxacin, aspirin, doxycycline, and nsaids.   MEDICATIONS:  Current Outpatient Medications  Medication Sig Dispense Refill   albuterol (VENTOLIN HFA) 108 (90 Base) MCG/ACT inhaler Inhale into the lungs every 4 (four) hours as needed for wheezing or shortness of breath.     ALPRAZolam (XANAX) 1 MG tablet Take 1 mg by mouth 4 (four) times daily as needed for anxiety.  5   anastrozole (ARIMIDEX) 1 MG tablet Take 1 tablet (1 mg total) by mouth daily. 30 tablet 6   ascorbic acid (VITAMIN C) 500 MG tablet Take 500 mg by mouth daily.     cholecalciferol (VITAMIN D3) 25 MCG (1000 UNIT) tablet Take 1,000 Units by mouth in the morning and at bedtime.      HYDROcodone-acetaminophen (NORCO) 10-325 MG tablet Take 1 tablet by mouth every 4 (four) hours as needed for severe pain.  0   ipratropium-albuterol (DUONEB) 0.5-2.5 (3) MG/3ML SOLN Inhale 3 mLs into the lungs every 6 (six) hours as needed (asthma).  11   loperamide (IMODIUM A-D) 2 MG tablet Take 2 mg by mouth 4 (four) times daily as needed for diarrhea or loose stools.     magnesium oxide (MAG-OX) 400 MG tablet Take 400 mg by mouth 2 (two) times daily.     OVER THE COUNTER MEDICATION Take 1 Scoop by mouth in the morning and at bedtime. Super Beets     No current facility-administered medications for this visit.     PHYSICAL EXAMINATION: Performance status (ECOG): 1 - Symptomatic but completely ambulatory  Vitals:   02/26/21 1407  BP: 139/62  Pulse: 95  Resp: 18  Temp: 98.1 F (36.7 C)  SpO2: (!) 88%   Wt Readings from Last 3 Encounters:  02/26/21 126 lb 6.4 oz (57.3 kg)  08/01/20 131 lb (59.4 kg)  07/17/20 123 lb (55.8 kg)   Physical Exam Vitals reviewed.  Constitutional:      Appearance: Normal appearance.  Cardiovascular:     Rate and Rhythm: Normal rate and regular rhythm.     Pulses: Normal pulses.     Heart sounds: Normal heart sounds.  Pulmonary:     Effort: Pulmonary effort is normal.  Breath sounds: Normal breath sounds.  Chest:  Breasts:    Right: Normal. No swelling, bleeding, inverted nipple, mass, nipple discharge, skin change or tenderness.     Left: Absent. No swelling, bleeding, mass, skin change or tenderness.  Abdominal:     Palpations: Abdomen is soft. There is no hepatomegaly, splenomegaly or mass.     Tenderness: There is no abdominal tenderness.  Musculoskeletal:     Right lower leg: No edema.     Left lower leg: No edema.  Lymphadenopathy:     Upper Body:     Right upper body: No supraclavicular, axillary or pectoral adenopathy.     Left upper body: No supraclavicular, axillary or pectoral adenopathy.  Neurological:     General: No focal  deficit present.     Mental Status: She is alert and oriented to person, place, and time.  Psychiatric:        Mood and Affect: Mood normal.        Behavior: Behavior normal.    Breast Exam Chaperone: Thana Ates     LABORATORY DATA:  I have reviewed the data as listed CMP Latest Ref Rng & Units 02/19/2021 07/17/2020 05/08/2020  Glucose 70 - 99 mg/dL 113(H) 95 97  BUN 8 - 23 mg/dL $Remove'10 12 9  'EiMGMDI$ Creatinine 0.44 - 1.00 mg/dL 0.63 0.55 0.68  Sodium 135 - 145 mmol/L 136 140 141  Potassium 3.5 - 5.1 mmol/L 4.2 4.6 4.9  Chloride 98 - 111 mmol/L 95(L) 99 104  CO2 22 - 32 mmol/L 36(H) 31 28  Calcium 8.9 - 10.3 mg/dL 9.2 9.6 9.4  Total Protein 6.5 - 8.1 g/dL 7.5 - 7.1  Total Bilirubin 0.3 - 1.2 mg/dL 0.4 - 0.3  Alkaline Phos 38 - 126 U/L 58 - 58  AST 15 - 41 U/L 24 - 22  ALT 0 - 44 U/L 12 - 13   Lab Results  Component Value Date   CAN153 15.1 11/30/2019   Lab Results  Component Value Date   WBC 6.7 02/19/2021   HGB 15.4 (H) 02/19/2021   HCT 49.7 (H) 02/19/2021   MCV 99.4 02/19/2021   PLT 277 02/19/2021   NEUTROABS 2.6 02/19/2021    ASSESSMENT:  1.  Recurrent left breast cancer: -Patient recently noticed a nodule in the left breast for the past several months. -As it was growing, she had mammogram right breast and ultrasound guided biopsy of the left breast on 11/06/2019 at [facility], [place]. -Pathology on 11/01/2019 consistent with grade 2 invasive ductal carcinoma, ER 95%, PR 0%, Ki-67 10%, HER-2 2+ by IHC, negative by FISH. -PET scan on 01/08/2020 with tiny soft tissue nodule in the medial aspect of the left mastectomy bed with low-level FDG accumulation compatible with biopsy-proven malignancy with no evidence of metastatic disease.  Tiny lung nodules nonspecific.   2.  Left breast cancer (stage Ia): -Status post left mastectomy and sentinel lymph node biopsy on 02/05/2015 by Dr. Anthony Sar in [place]. -Pathology shows 6 mm, grade 1, positive lymphovascular invasion, single focus, margins  negative, ER more than 90%, PR 10%, Ki-67 26%, HER-2 2+, negative by FISH. -She did not take antiestrogen therapy as she was afraid of the side effects.  -Excision of the left breast nodule on 02/16/2020 by Dr. Arnoldo Morale. 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  Infiltrating ductal carcinoma involving superior and inferior margins and medial margin.  Tumor measures 1 x 0.8 x 0.8 cm.  ER was  95% positive, PR negative, HER-2 2+ and negative by FISH.  Ki-67 10%. - Reexcision of the left breast tissue shows grade 2 IDC on 05/10/2020.  Invasive carcinoma present at the inferior medial margin focally and less than 1 mm from the deep margin focally. - Left breast tissue excision on 07/19/2020 with no residual malignancy.   3.  Social/family history: -Lives at home with ex-husband.  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 -Paternal uncle had cancer, type unknown to the patient.   4.  Osteoporosis: -Last bone density on 10/28/2016 shows T score -2.9. -She was not treated for osteoporosis. -DEXA scan on 11/20/2019 with T score -3.2.   PLAN:  1.  Recurrent left breast cancer, ER positive, PR and HER-2 negative: - She underwent left breast tissue excision on 07/19/2020 which did not show any malignancy. - She took about 10 days of anastrozole and stopped taking it due to arms and bones aching.  She reported pains all over.  Denied any major hot flashes. - Today left mastectomy site is within normal limits.  Right breast has no palpable masses.  No palpable adenopathy.  I have recommended a mammogram. - I have talked to her about starting her on tamoxifen 20 mg daily.  We discussed side effects. - RTC 2 months with labs to see how she is tolerating tamoxifen.   2.  Osteoporosis: - We will plan to start her on Prolia after dental evaluation is done. - She will continue calcium and vitamin D supplements.  3.   Smoking history: - She is a 40-pack-year smoker. - Recommend CT chest lung cancer screening scan.    Breast Cancer therapy associated bone loss: I have recommended calcium, Vitamin D and weight bearing exercises.  Orders placed this encounter:  Orders Placed This Encounter  Procedures   CT CHEST LUNG CA SCREEN LOW DOSE W/O CM   MM DIAG BREAST TOMO UNI RIGHT    The patient has a good understanding of the overall plan. She agrees with it. She will call with any problems that may develop before the next visit here.  Derek Jack, MD [place] [phone]   I, Thana Ates, am acting as a scribe for Dr. Derek Jack.  I, Derek Jack MD, have reviewed the above documentation for accuracy and completeness, and I agree with the above.

## 2021-02-26 NOTE — Patient Instructions (Addendum)
[place] at [facility] Discharge Instructions   You were seen and examined today by Dr. Delton Coombes.  He reviewed the results of your lab work which is normal/stable.  We will schedule you for a mammogram, and also a CT scan of your chest to screen for lung cancer due to your smoking history.  Return as scheduled for lab work and office visit.    Thank you for choosing [place] at [facility] to provide your oncology and hematology care.  To afford each patient quality time with our provider, please arrive at least 15 minutes before your scheduled appointment time.   If you have a lab appointment with the [facility] please come in thru the Main Entrance and check in at the main information desk.  You need to re-schedule your appointment should you arrive 10 or more minutes late.  We strive to give you quality time with our providers, and arriving late affects you and other patients whose appointments are after yours.  Also, if you no show three or more times for appointments you may be dismissed from the clinic at the providers discretion.     Again, thank you for choosing [facility].  Our hope is that these requests will decrease the amount of time that you wait before being seen by our physicians.       _____________________________________________________________  Should you have questions after your visit to [facility], please contact our office at [phone] and follow the prompts.  Our office hours are 8:00 a.m. and 4:30 p.m. Monday - Friday.  Please note that voicemails left after 4:00 p.m. may not be returned until the following business day.  We are closed weekends and major holidays.  You do have access to a nurse 24-7, just call the main number to the clinic [phone] and do not press any options, hold on the line and a nurse will answer the phone.    For prescription refill requests, have your  pharmacy contact our office and allow 72 hours.    Due to Covid, you will need to wear a mask upon entering the hospital. If you do not have a mask, a mask will be given to you at the Main Entrance upon arrival. For doctor visits, patients may have 1 support person age 35 or older with them. For treatment visits, patients can not have anyone with them due to social distancing guidelines and our immunocompromised population.

## 2021-04-07 ENCOUNTER — Other Ambulatory Visit: Payer: Self-pay

## 2021-04-07 ENCOUNTER — Ambulatory Visit (HOSPITAL_COMMUNITY)
Admission: RE | Admit: 2021-04-07 | Discharge: 2021-04-07 | Disposition: A | Discharge status: Home / Self Care | Payer: Medicare PPO | Source: Ambulatory Visit | Attending: Hematology | Admitting: Hematology

## 2021-04-07 DIAGNOSIS — I2510 Atherosclerotic heart disease of native coronary artery without angina pectoris: Secondary | ICD-10-CM | POA: Diagnosis not present

## 2021-04-07 DIAGNOSIS — C50412 Malignant neoplasm of upper-outer quadrant of left female breast: Secondary | ICD-10-CM | POA: Insufficient documentation

## 2021-04-07 DIAGNOSIS — J432 Centrilobular emphysema: Secondary | ICD-10-CM | POA: Insufficient documentation

## 2021-04-07 DIAGNOSIS — Z122 Encounter for screening for malignant neoplasm of respiratory organs: Secondary | ICD-10-CM | POA: Diagnosis not present

## 2021-04-07 DIAGNOSIS — Z170 Estrogen receptor positive status [ER+]: Secondary | ICD-10-CM | POA: Insufficient documentation

## 2021-04-07 DIAGNOSIS — F17210 Nicotine dependence, cigarettes, uncomplicated: Secondary | ICD-10-CM | POA: Diagnosis not present

## 2021-04-07 DIAGNOSIS — I700 Atherosclerosis of aorta: Secondary | ICD-10-CM | POA: Insufficient documentation

## 2021-04-07 DIAGNOSIS — Z87891 Personal history of nicotine dependence: Secondary | ICD-10-CM | POA: Diagnosis not present

## 2021-04-10 DIAGNOSIS — C50412 Malignant neoplasm of upper-outer quadrant of left female breast: Secondary | ICD-10-CM | POA: Diagnosis not present

## 2021-04-10 DIAGNOSIS — F17210 Nicotine dependence, cigarettes, uncomplicated: Secondary | ICD-10-CM | POA: Diagnosis not present

## 2021-04-10 DIAGNOSIS — Z299 Encounter for prophylactic measures, unspecified: Secondary | ICD-10-CM | POA: Diagnosis not present

## 2021-04-10 DIAGNOSIS — Z170 Estrogen receptor positive status [ER+]: Secondary | ICD-10-CM | POA: Diagnosis not present

## 2021-04-10 DIAGNOSIS — J069 Acute upper respiratory infection, unspecified: Secondary | ICD-10-CM | POA: Diagnosis not present

## 2021-04-10 DIAGNOSIS — D692 Other nonthrombocytopenic purpura: Secondary | ICD-10-CM | POA: Diagnosis not present

## 2021-04-14 DIAGNOSIS — J440 Chronic obstructive pulmonary disease with acute lower respiratory infection: Secondary | ICD-10-CM | POA: Diagnosis not present

## 2021-04-14 DIAGNOSIS — D692 Other nonthrombocytopenic purpura: Secondary | ICD-10-CM | POA: Diagnosis not present

## 2021-04-14 DIAGNOSIS — J209 Acute bronchitis, unspecified: Secondary | ICD-10-CM | POA: Diagnosis not present

## 2021-04-14 DIAGNOSIS — Z299 Encounter for prophylactic measures, unspecified: Secondary | ICD-10-CM | POA: Diagnosis not present

## 2021-04-14 DIAGNOSIS — J441 Chronic obstructive pulmonary disease with (acute) exacerbation: Secondary | ICD-10-CM | POA: Diagnosis not present

## 2021-04-16 DIAGNOSIS — G894 Chronic pain syndrome: Secondary | ICD-10-CM | POA: Diagnosis not present

## 2021-04-16 DIAGNOSIS — M461 Sacroiliitis, not elsewhere classified: Secondary | ICD-10-CM | POA: Diagnosis not present

## 2021-04-16 DIAGNOSIS — M5416 Radiculopathy, lumbar region: Secondary | ICD-10-CM | POA: Diagnosis not present

## 2021-04-17 ENCOUNTER — Other Ambulatory Visit (HOSPITAL_COMMUNITY): Payer: Medicare PPO

## 2021-04-24 ENCOUNTER — Ambulatory Visit (HOSPITAL_COMMUNITY)
Admission: RE | Admit: 2021-04-24 | Discharge: 2021-04-24 | Disposition: A | Discharge status: Home / Self Care | Payer: Medicare PPO | Source: Ambulatory Visit | Attending: Hematology | Admitting: Hematology

## 2021-04-24 ENCOUNTER — Ambulatory Visit (HOSPITAL_COMMUNITY): Payer: Medicare PPO

## 2021-04-24 ENCOUNTER — Inpatient Hospital Stay (HOSPITAL_COMMUNITY): Payer: Medicare PPO | Attending: Hematology

## 2021-04-24 DIAGNOSIS — Z79810 Long term (current) use of selective estrogen receptor modulators (SERMs): Secondary | ICD-10-CM | POA: Diagnosis not present

## 2021-04-24 DIAGNOSIS — Z1231 Encounter for screening mammogram for malignant neoplasm of breast: Secondary | ICD-10-CM | POA: Diagnosis not present

## 2021-04-24 DIAGNOSIS — C50912 Malignant neoplasm of unspecified site of left female breast: Secondary | ICD-10-CM | POA: Diagnosis not present

## 2021-04-24 DIAGNOSIS — K589 Irritable bowel syndrome without diarrhea: Secondary | ICD-10-CM | POA: Diagnosis not present

## 2021-04-24 DIAGNOSIS — M810 Age-related osteoporosis without current pathological fracture: Secondary | ICD-10-CM | POA: Insufficient documentation

## 2021-04-24 DIAGNOSIS — Z170 Estrogen receptor positive status [ER+]: Secondary | ICD-10-CM | POA: Insufficient documentation

## 2021-04-24 LAB — CBC WITH DIFFERENTIAL/PLATELET
Abs Immature Granulocytes: 0.02 10*3/uL (ref 0.00–0.07)
Basophils Absolute: 0 10*3/uL (ref 0.0–0.1)
Basophils Relative: 1 %
Eosinophils Absolute: 0.1 10*3/uL (ref 0.0–0.5)
Eosinophils Relative: 2 %
HCT: 52 % — ABNORMAL HIGH (ref 36.0–46.0)
Hemoglobin: 15.7 g/dL — ABNORMAL HIGH (ref 12.0–15.0)
Immature Granulocytes: 0 %
Lymphocytes Relative: 38 %
Lymphs Abs: 2.2 10*3/uL (ref 0.7–4.0)
MCH: 29.9 pg (ref 26.0–34.0)
MCHC: 30.2 g/dL (ref 30.0–36.0)
MCV: 99 fL (ref 80.0–100.0)
Monocytes Absolute: 0.6 10*3/uL (ref 0.1–1.0)
Monocytes Relative: 10 %
Neutro Abs: 2.8 10*3/uL (ref 1.7–7.7)
Neutrophils Relative %: 49 %
Platelets: 254 10*3/uL (ref 150–400)
RBC: 5.25 MIL/uL — ABNORMAL HIGH (ref 3.87–5.11)
RDW: 13.1 % (ref 11.5–15.5)
WBC: 5.7 10*3/uL (ref 4.0–10.5)
nRBC: 0 % (ref 0.0–0.2)

## 2021-04-24 LAB — COMPREHENSIVE METABOLIC PANEL
ALT: 16 U/L (ref 0–44)
AST: 25 U/L (ref 15–41)
Albumin: 4.4 g/dL (ref 3.5–5.0)
Alkaline Phosphatase: 53 U/L (ref 38–126)
Anion gap: 7 (ref 5–15)
BUN: 11 mg/dL (ref 8–23)
CO2: 36 mmol/L — ABNORMAL HIGH (ref 22–32)
Calcium: 9.8 mg/dL (ref 8.9–10.3)
Chloride: 96 mmol/L — ABNORMAL LOW (ref 98–111)
Creatinine, Ser: 0.66 mg/dL (ref 0.44–1.00)
GFR, Estimated: >60 mL/min (ref >60)
Glucose, Bld: 105 mg/dL — ABNORMAL HIGH (ref 70–99)
Potassium: 5.1 mmol/L (ref 3.5–5.1)
Sodium: 139 mmol/L (ref 135–145)
Total Bilirubin: 0.5 mg/dL (ref 0.3–1.2)
Total Protein: 7.3 g/dL (ref 6.5–8.1)

## 2021-04-24 LAB — VITAMIN D 25 HYDROXY (VIT D DEFICIENCY, FRACTURES): Vit D, 25-Hydroxy: 66.28 ng/mL (ref 30–100)

## 2021-05-01 ENCOUNTER — Ambulatory Visit (HOSPITAL_COMMUNITY): Payer: Medicare PPO | Admitting: Hematology

## 2021-05-06 ENCOUNTER — Inpatient Hospital Stay (HOSPITAL_COMMUNITY): Payer: Medicare PPO | Admitting: Hematology

## 2021-05-06 VITALS — BP 126/56 | HR 82 | Temp 97.4°F | Resp 20 | Ht 59.25 in | Wt 120.2 lb

## 2021-05-06 DIAGNOSIS — C50412 Malignant neoplasm of upper-outer quadrant of left female breast: Secondary | ICD-10-CM

## 2021-05-06 DIAGNOSIS — C50912 Malignant neoplasm of unspecified site of left female breast: Secondary | ICD-10-CM | POA: Diagnosis not present

## 2021-05-06 DIAGNOSIS — Z170 Estrogen receptor positive status [ER+]: Secondary | ICD-10-CM

## 2021-05-06 DIAGNOSIS — Z79810 Long term (current) use of selective estrogen receptor modulators (SERMs): Secondary | ICD-10-CM | POA: Diagnosis not present

## 2021-05-06 DIAGNOSIS — M810 Age-related osteoporosis without current pathological fracture: Secondary | ICD-10-CM | POA: Diagnosis not present

## 2021-05-06 DIAGNOSIS — K589 Irritable bowel syndrome without diarrhea: Secondary | ICD-10-CM | POA: Diagnosis not present

## 2021-05-06 NOTE — Patient Instructions (Signed)
[place] at [facility] ?Discharge Instructions ? ? ?You were seen and examined today by Dr. Delton Coombes. ? ?He reviewed your lab work which is normal/stable. ? ?He reviewed your chest CT which does not show any evidence of cancer. ? ?Start taking Tamoxifen daily.  ? ?Return as scheduled in 4 months.  ? ? ?Thank you for choosing [place] at [facility] to provide your oncology and hematology care.  To afford each patient quality time with our provider, please arrive at least 15 minutes before your scheduled appointment time.  ? ?If you have a lab appointment with the [facility] please come in thru the Main Entrance and check in at the main information desk. ? ?You need to re-schedule your appointment should you arrive 10 or more minutes late.  We strive to give you quality time with our providers, and arriving late affects you and other patients whose appointments are after yours.  Also, if you no show three or more times for appointments you may be dismissed from the clinic at the providers discretion.     ?Again, thank you for choosing [facility].  Our hope is that these requests will decrease the amount of time that you wait before being seen by our physicians.       ?_____________________________________________________________ ? ?Should you have questions after your visit to [facility], please contact our office at [phone] and follow the prompts.  Our office hours are 8:00 a.m. and 4:30 p.m. Monday - Friday.  Please note that voicemails left after 4:00 p.m. may not be returned until the following business day.  We are closed weekends and major holidays.  You do have access to a nurse 24-7, just call the main number to the clinic [phone] and do not press any options, hold on the line and a nurse will answer the phone.   ? ?For prescription refill requests, have your pharmacy contact our office and allow 72 hours.   ? ?Due  to Covid, you will need to wear a mask upon entering the hospital. If you do not have a mask, a mask will be given to you at the Main Entrance upon arrival. For doctor visits, patients may have 1 support person age 52 or older with them. For treatment visits, patients can not have anyone with them due to social distancing guidelines and our immunocompromised population.  ? ?   ?

## 2021-05-06 NOTE — Progress Notes (Signed)
? ?[name] ?[address] ?[place], [place] [address] ? ? ?Patient Care Team: ?Neale Burly, MD as PCP - General (Internal Medicine) ?Brien Mates, RN as Oncology Nurse Navigator (Oncology) ? ?SUMMARY OF ONCOLOGIC HISTORY: ?Oncology History  ? No history exists.  ? ? ?CHIEF COMPLIANT: Follow-up for left breast cancer ? ? ?INTERVAL HISTORY: [name] is a 71 y.o. female here today for follow up of her left breast cancer. Her last visit was on 02/26/2021.  ? ?Today she reports feeling well. She has not taken tamoxifen as she reports she has had diarrhea which she associates with her IBS.  ? ?REVIEW OF SYSTEMS:   ?Review of Systems  ?Constitutional:  Positive for fatigue. Negative for appetite change.  ?HENT:   Positive for trouble swallowing.   ?Respiratory:  Positive for cough and shortness of breath.   ?Gastrointestinal:  Positive for diarrhea and nausea.  ?Musculoskeletal:  Positive for arthralgias (6/10 R hip) and neck pain.  ?Neurological:  Positive for dizziness and headaches.  ?All other systems reviewed and are negative. ? ?I have reviewed the past medical history, past surgical history, social history and family history with the patient and they are unchanged from previous note. ? ? ?ALLERGIES:   ?is allergic to ciprofloxacin, aspirin, doxycycline, and nsaids. ? ? ?MEDICATIONS:  ?Current Outpatient Medications  ?Medication Sig Dispense Refill  ? albuterol (VENTOLIN HFA) 108 (90 Base) MCG/ACT inhaler Inhale into the lungs every 4 (four) hours as needed for wheezing or shortness of breath.    ? ALPRAZolam (XANAX) 1 MG tablet Take 1 mg by mouth 4 (four) times daily as needed for anxiety.  5  ? ascorbic acid (VITAMIN C) 500 MG tablet Take 500 mg by mouth daily.    ? cholecalciferol (VITAMIN D3) 25 MCG (1000 UNIT) tablet Take 1,000 Units by mouth in the morning and at bedtime.    ? HYDROcodone-acetaminophen (NORCO) 10-325 MG tablet Take 1 tablet by mouth every 4 (four) hours as needed  for severe pain.  0  ? ipratropium-albuterol (DUONEB) 0.5-2.5 (3) MG/3ML SOLN Inhale 3 mLs into the lungs every 6 (six) hours as needed (asthma).  11  ? loperamide (IMODIUM A-D) 2 MG tablet Take 2 mg by mouth 4 (four) times daily as needed for diarrhea or loose stools.    ? magnesium oxide (MAG-OX) 400 MG tablet Take 400 mg by mouth 2 (two) times daily.    ? OVER THE COUNTER MEDICATION Take 1 Scoop by mouth in the morning and at bedtime. Super Beets    ? tamoxifen (NOLVADEX) 20 MG tablet Take 1 tablet (20 mg total) by mouth daily. 90 tablet 0  ? ?No current facility-administered medications for this visit.  ? ? ? ?PHYSICAL EXAMINATION: ?Performance status (ECOG): 1 - Symptomatic but completely ambulatory ? ?Vitals:  ? 05/06/21 1011  ?BP: (!) 126/56  ?Pulse: 82  ?Resp: 20  ?Temp: (!) 97.4 ?F (36.3 ?C)  ?SpO2: 93%  ? ?Wt Readings from Last 3 Encounters:  ?05/06/21 120 lb 3.2 oz (54.5 kg)  ?02/26/21 126 lb 6.4 oz (57.3 kg)  ?08/01/20 131 lb (59.4 kg)  ? ?Physical Exam ?Vitals reviewed.  ?Constitutional:   ?   Appearance: Normal appearance.  ?Cardiovascular:  ?   Rate and Rhythm: Normal rate and regular rhythm.  ?   Pulses: Normal pulses.  ?   Heart sounds: Normal heart sounds.  ?Pulmonary:  ?   Effort: Pulmonary effort is normal.  ?  Breath sounds: Normal breath sounds.  ?Chest:  ?Breasts: ?   Left: Absent. No mass or skin change (mastectomy site WNL).  ?Neurological:  ?   General: No focal deficit present.  ?   Mental Status: She is alert and oriented to person, place, and time.  ?Psychiatric:     ?   Mood and Affect: Mood normal.     ?   Behavior: Behavior normal.  ? ? ?Breast Exam Chaperone: Thana Ates   ? ? ?LABORATORY DATA:  ?I have reviewed the data as listed ? ?  Latest Ref Rng & Units 04/24/2021  ?  1:29 PM 02/19/2021  ? 10:54 AM 07/17/2020  ?  1:45 PM  ?CMP  ?Glucose 70 - 99 mg/dL 105   113   95    ?BUN 8 - 23 mg/dL $Remove'11   10   12    'uPjxGLi$ ?Creatinine 0.44 - 1.00 mg/dL 0.66   0.63   0.55    ?Sodium 135 - 145 mmol/L 139    136   140    ?Potassium 3.5 - 5.1 mmol/L 5.1   4.2   4.6    ?Chloride 98 - 111 mmol/L 96   95   99    ?CO2 22 - 32 mmol/L 36   36   31    ?Calcium 8.9 - 10.3 mg/dL 9.8   9.2   9.6    ?Total Protein 6.5 - 8.1 g/dL 7.3   7.5     ?Total Bilirubin 0.3 - 1.2 mg/dL 0.5   0.4     ?Alkaline Phos 38 - 126 U/L 53   58     ?AST 15 - 41 U/L 25   24     ?ALT 0 - 44 U/L 16   12     ? ?Lab Results  ?Component Value Date  ? IOM355 15.1 11/30/2019  ? ?Lab Results  ?Component Value Date  ? WBC 5.7 04/24/2021  ? HGB 15.7 (H) 04/24/2021  ? HCT 52.0 (H) 04/24/2021  ? MCV 99.0 04/24/2021  ? PLT 254 04/24/2021  ? NEUTROABS 2.8 04/24/2021  ? ? ?ASSESSMENT:  ?1.  Recurrent left breast cancer: ?-Patient recently noticed a nodule in the left breast for the past several months. ?-As it was growing, she had mammogram right breast and ultrasound guided biopsy of the left breast on 11/06/2019 at [facility], [place]. ?-Pathology on 11/01/2019 consistent with grade 2 invasive ductal carcinoma, ER 95%, PR 0%, Ki-67 10%, HER-2 2+ by IHC, negative by FISH. ?-PET scan on 01/08/2020 with tiny soft tissue nodule in the medial aspect of the left mastectomy bed with low-level FDG accumulation compatible with biopsy-proven malignancy with no evidence of metastatic disease.  Tiny lung nodules nonspecific. ?  ?2.  Left breast cancer (stage Ia): ?-Status post left mastectomy and sentinel lymph node biopsy on 02/05/2015 by Dr. Anthony Sar in [place]. ?-Pathology shows 6 mm, grade 1, positive lymphovascular invasion, single focus, margins negative, ER more than 90%, PR 10%, Ki-67 26%, HER-2 2+, negative by FISH. ?-She did not take antiestrogen therapy as she was afraid of the side effects. ? -Excision of the left breast nodule on 02/16/2020 by Dr. Arnoldo Morale. 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  Infiltrating ductal carcinoma involving superior and inferior margins and medial margin.  Tumor measures 1 x 0.8  x 0.8 cm.  ER was 95% positive, PR negative, HER-2 2+ and negative by FISH.  Ki-67 10%. ?-  Reexcision of the left breast tissue shows grade 2 IDC on 05/10/2020.  Invasive carcinoma present at the inferior medial margin focally and less than 1 mm from the deep margin focally. ?- Left breast tissue excision on 07/19/2020 with no residual malignancy. ?  ?3.  Social/family history: ?-Lives at home with ex-husband.  She used to do office work. 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 ?-Paternal uncle had cancer, type unknown to the patient. ?  ?4.  Osteoporosis: ?-Last bone density on 10/28/2016 shows T score -2.9. ?-She was not treated for osteoporosis. ?-DEXA scan on 11/20/2019 with T score -3.2. ? ? ?PLAN:  ?1.  Recurrent left breast cancer, ER positive, PR and HER-2 negative: ?- She took anastrozole for 10 days and stopped taking it due to aching pain in the arms and bones. ?- At last visit I have given prescription for tamoxifen. ?- She reportedly had diarrhea from IBS and did not start tamoxifen yet. ?- I have counseled her about importance of taking antiestrogen therapy as she already had recurrent breast cancer when she did not take antiestrogen therapy in 2017. ?- She reported that she will plan to start it back next week.  She will call us if there is any problem.  I will plan to see her back in 4 months for follow-up. ?- Reviewed labs from 04/24/2021 which showed normal LFTs.  CBC shows slightly elevated hemoglobin and hematocrit, most likely from smoking.  We will closely monitor. ?  ?2.  Osteoporosis: ?- We have previously discussed about starting her on Prolia after dental evaluation is done.  We also discussed side effects in detail. ?- She will continue calcium and D supplements. ? ?3.  Smoking history: ?- She is a 40-pack-year smoker. ?- Reviewed CT lung cancer screening scan from 04/07/2021: Lung RADS 2S.  Repeat imaging in 1 year.  Now on cancer findings were discussed. ? ?Breast Cancer therapy  associated bone loss: I have recommended calcium, Vitamin D and weight bearing exercises. ? ?Orders placed this encounter:  ?No orders of the defined types were placed in this encounter. ? ? ?The [facility]ie

## 2021-05-25 ENCOUNTER — Other Ambulatory Visit (HOSPITAL_COMMUNITY): Payer: Self-pay | Admitting: Hematology

## 2021-05-26 ENCOUNTER — Other Ambulatory Visit: Payer: Self-pay | Admitting: *Deleted

## 2021-05-26 NOTE — Telephone Encounter (Signed)
Tamoxifen refill approved per request.  Patient is tolerating and should continue therapy. ?

## 2021-06-02 DIAGNOSIS — F331 Major depressive disorder, recurrent, moderate: Secondary | ICD-10-CM | POA: Diagnosis not present

## 2021-07-14 DIAGNOSIS — Z299 Encounter for prophylactic measures, unspecified: Secondary | ICD-10-CM | POA: Diagnosis not present

## 2021-07-14 DIAGNOSIS — J209 Acute bronchitis, unspecified: Secondary | ICD-10-CM | POA: Diagnosis not present

## 2021-07-14 DIAGNOSIS — F17210 Nicotine dependence, cigarettes, uncomplicated: Secondary | ICD-10-CM | POA: Diagnosis not present

## 2021-07-14 DIAGNOSIS — J440 Chronic obstructive pulmonary disease with acute lower respiratory infection: Secondary | ICD-10-CM | POA: Diagnosis not present

## 2021-07-14 DIAGNOSIS — R350 Frequency of micturition: Secondary | ICD-10-CM | POA: Diagnosis not present

## 2021-07-14 DIAGNOSIS — N390 Urinary tract infection, site not specified: Secondary | ICD-10-CM | POA: Diagnosis not present

## 2021-07-23 DIAGNOSIS — H35373 Puckering of macula, bilateral: Secondary | ICD-10-CM | POA: Diagnosis not present

## 2021-07-23 DIAGNOSIS — H52223 Regular astigmatism, bilateral: Secondary | ICD-10-CM | POA: Diagnosis not present

## 2021-07-23 DIAGNOSIS — H5203 Hypermetropia, bilateral: Secondary | ICD-10-CM | POA: Diagnosis not present

## 2021-07-23 DIAGNOSIS — H25813 Combined forms of age-related cataract, bilateral: Secondary | ICD-10-CM | POA: Diagnosis not present

## 2021-07-23 DIAGNOSIS — G894 Chronic pain syndrome: Secondary | ICD-10-CM | POA: Diagnosis not present

## 2021-07-23 DIAGNOSIS — H35352 Cystoid macular degeneration, left eye: Secondary | ICD-10-CM | POA: Diagnosis not present

## 2021-07-23 DIAGNOSIS — M461 Sacroiliitis, not elsewhere classified: Secondary | ICD-10-CM | POA: Diagnosis not present

## 2021-07-23 DIAGNOSIS — M5416 Radiculopathy, lumbar region: Secondary | ICD-10-CM | POA: Diagnosis not present

## 2021-07-23 DIAGNOSIS — H524 Presbyopia: Secondary | ICD-10-CM | POA: Diagnosis not present

## 2021-08-28 DIAGNOSIS — H2513 Age-related nuclear cataract, bilateral: Secondary | ICD-10-CM | POA: Diagnosis not present

## 2021-08-28 DIAGNOSIS — H35373 Puckering of macula, bilateral: Secondary | ICD-10-CM | POA: Diagnosis not present

## 2021-09-02 ENCOUNTER — Inpatient Hospital Stay: Payer: Medicare PPO | Attending: Hematology

## 2021-09-02 DIAGNOSIS — Z170 Estrogen receptor positive status [ER+]: Secondary | ICD-10-CM

## 2021-09-02 DIAGNOSIS — M810 Age-related osteoporosis without current pathological fracture: Secondary | ICD-10-CM | POA: Insufficient documentation

## 2021-09-02 DIAGNOSIS — C50912 Malignant neoplasm of unspecified site of left female breast: Secondary | ICD-10-CM | POA: Insufficient documentation

## 2021-09-02 LAB — CBC WITH DIFFERENTIAL/PLATELET
Abs Immature Granulocytes: 0.01 10*3/uL (ref 0.00–0.07)
Basophils Absolute: 0 10*3/uL (ref 0.0–0.1)
Basophils Relative: 1 %
Eosinophils Absolute: 0.1 10*3/uL (ref 0.0–0.5)
Eosinophils Relative: 2 %
HCT: 42 % (ref 36.0–46.0)
Hemoglobin: 13.1 g/dL (ref 12.0–15.0)
Immature Granulocytes: 0 %
Lymphocytes Relative: 47 %
Lymphs Abs: 2.5 10*3/uL (ref 0.7–4.0)
MCH: 31 pg (ref 26.0–34.0)
MCHC: 31.2 g/dL (ref 30.0–36.0)
MCV: 99.5 fL (ref 80.0–100.0)
Monocytes Absolute: 0.3 10*3/uL (ref 0.1–1.0)
Monocytes Relative: 7 %
Neutro Abs: 2.2 10*3/uL (ref 1.7–7.7)
Neutrophils Relative %: 43 %
Platelets: 231 10*3/uL (ref 150–400)
RBC: 4.22 MIL/uL (ref 3.87–5.11)
RDW: 12.8 % (ref 11.5–15.5)
WBC: 5.2 10*3/uL (ref 4.0–10.5)
nRBC: 0 % (ref 0.0–0.2)

## 2021-09-02 LAB — COMPREHENSIVE METABOLIC PANEL
ALT: 13 U/L (ref 0–44)
AST: 21 U/L (ref 15–41)
Albumin: 4.1 g/dL (ref 3.5–5.0)
Alkaline Phosphatase: 52 U/L (ref 38–126)
Anion gap: 7 (ref 5–15)
BUN: 9 mg/dL (ref 8–23)
CO2: 33 mmol/L — ABNORMAL HIGH (ref 22–32)
Calcium: 9.3 mg/dL (ref 8.9–10.3)
Chloride: 100 mmol/L (ref 98–111)
Creatinine, Ser: 0.54 mg/dL (ref 0.44–1.00)
GFR, Estimated: >60 mL/min (ref >60)
Glucose, Bld: 107 mg/dL — ABNORMAL HIGH (ref 70–99)
Potassium: 4.8 mmol/L (ref 3.5–5.1)
Sodium: 140 mmol/L (ref 135–145)
Total Bilirubin: 0.4 mg/dL (ref 0.3–1.2)
Total Protein: 7.1 g/dL (ref 6.5–8.1)

## 2021-09-02 LAB — VITAMIN D 25 HYDROXY (VIT D DEFICIENCY, FRACTURES): Vit D, 25-Hydroxy: 66.87 ng/mL (ref 30–100)

## 2021-09-09 ENCOUNTER — Inpatient Hospital Stay: Payer: Medicare PPO | Admitting: Hematology

## 2021-09-15 ENCOUNTER — Ambulatory Visit: Payer: Medicare PPO | Admitting: Hematology

## 2021-09-23 ENCOUNTER — Inpatient Hospital Stay: Payer: Medicare PPO | Attending: Hematology | Admitting: Hematology

## 2021-09-23 ENCOUNTER — Encounter: Payer: Self-pay | Admitting: Hematology

## 2021-09-23 ENCOUNTER — Other Ambulatory Visit: Payer: Self-pay

## 2021-09-23 DIAGNOSIS — C50912 Malignant neoplasm of unspecified site of left female breast: Secondary | ICD-10-CM

## 2021-09-23 NOTE — Progress Notes (Signed)
Virtual Visit via Telephone Note  I connected with Virginia Blake on 09/23/21 at 12:00 PM EDT by telephone and verified that I am speaking with the correct person using two identifiers.  Location: Patient: At home Provider: In the office   I discussed the limitations, risks, security and privacy concerns of performing an evaluation and management service by telephone and the availability of in person appointments. I also discussed with the patient that there may be a patient responsible charge related to this service. The patient expressed understanding and agreed to proceed.   History of Present Illness: This patient is seen in our clinic for recurrent left breast cancer.  Last recurrence was in October 2021.  She could not tolerate anastrozole.   Observations/Objective: After last visit in April of this year, she has tried taking tamoxifen for few weeks.  She has experienced body pains and stopped it about 3 months ago.  In the last 2 weeks, she reports that she has felt a couple of knots on the left chest wall at the mastectomy site.  Unfortunately she was not able to come to her office visit today as she had upper respiratory infection and cough.  Assessment and Plan:  1.  Recurrent left breast cancer, ER positive, PR and HER2 negative: - She could not tolerate anastrozole and tamoxifen. - We reviewed her labs which shows normal LFTs and CBC. - She reports noticing 2 knots at the left mastectomy site in the last 2 weeks. - She was recommended to follow-up with Dr. Arnoldo Morale for further evaluation. - I will see her back in 4 to 6 weeks for follow-up.  2.  Osteoporosis: - We discussed in the past about Prolia after dental evaluation.  Vitamin D is normal.  Continue calcium and vitamin D supplements.  3.  Smoking history: - She will require another lung cancer screening scan in March next year.   Follow Up Instructions:  RTC 4 to 6 weeks for follow-up. I discussed the assessment and  treatment plan with the patient. The patient was provided an opportunity to ask questions and all were answered. The patient agreed with the plan and demonstrated an understanding of the instructions.   The patient was advised to call back or seek an in-person evaluation if the symptoms worsen or if the condition fails to improve as anticipated.  I provided 21 minutes of non-face-to-face time during this encounter.   Derek Jack, MD

## 2021-10-02 DIAGNOSIS — J209 Acute bronchitis, unspecified: Secondary | ICD-10-CM | POA: Diagnosis not present

## 2021-10-02 DIAGNOSIS — D692 Other nonthrombocytopenic purpura: Secondary | ICD-10-CM | POA: Diagnosis not present

## 2021-10-02 DIAGNOSIS — J440 Chronic obstructive pulmonary disease with acute lower respiratory infection: Secondary | ICD-10-CM | POA: Diagnosis not present

## 2021-10-02 DIAGNOSIS — Z299 Encounter for prophylactic measures, unspecified: Secondary | ICD-10-CM | POA: Diagnosis not present

## 2021-10-02 DIAGNOSIS — F17210 Nicotine dependence, cigarettes, uncomplicated: Secondary | ICD-10-CM | POA: Diagnosis not present

## 2021-10-02 DIAGNOSIS — F1320 Sedative, hypnotic or anxiolytic dependence, uncomplicated: Secondary | ICD-10-CM | POA: Diagnosis not present

## 2021-10-21 ENCOUNTER — Ambulatory Visit: Payer: Medicare PPO | Admitting: General Surgery

## 2021-10-21 ENCOUNTER — Encounter: Payer: Self-pay | Admitting: General Surgery

## 2021-10-21 VITALS — BP 126/71 | HR 80 | Temp 98.1°F | Resp 12 | Ht 59.0 in | Wt 117.0 lb

## 2021-10-21 DIAGNOSIS — C50912 Malignant neoplasm of unspecified site of left female breast: Secondary | ICD-10-CM | POA: Diagnosis not present

## 2021-10-22 DIAGNOSIS — M5416 Radiculopathy, lumbar region: Secondary | ICD-10-CM | POA: Diagnosis not present

## 2021-10-22 DIAGNOSIS — M461 Sacroiliitis, not elsewhere classified: Secondary | ICD-10-CM | POA: Diagnosis not present

## 2021-10-22 DIAGNOSIS — G894 Chronic pain syndrome: Secondary | ICD-10-CM | POA: Diagnosis not present

## 2021-10-22 NOTE — H&P (Signed)
Virginia Blake; [id number]; [birth year]   HPI Patient is a 71 year old white female who was referred back to my care by Dr. Delton Coombes of oncology for biopsy of recurrent nodules along her surgical incision site.  She is status post left mastectomy with recurrence along the chest wall in the past.  She recently noted to tender nodules along the chest wall just beneath the surgical scar.  I have removed some of these in the past that have been positive for recurrent cancer. Past Medical History:  Diagnosis Date   Breast cancer ([place])    left mastectomy  2017   Chronic fatigue syndrome    Complication of anesthesia    history of difficulty waking up after surgery   COPD (chronic obstructive pulmonary disease) (HCC)    Family history of skin cancer    Fibromyalgia    Hx of skin cancer, basal cell    Right posterior upper arm   Osteoporosis     Past Surgical History:  Procedure Laterality Date   ABDOMINAL HYSTERECTOMY  1998   INCONTINENCE SURGERY     MASTECTOMY, PARTIAL Left 02/16/2020   Procedure: MASTECTOMY PARTIAL;  Surgeon: Aviva Signs, MD;  Location: [facility];  Service: General;  Laterality: Left;   MASTECTOMY, PARTIAL Left 05/10/2020   Procedure: MASTECTOMY PARTIAL;  Surgeon: Aviva Signs, MD;  Location: [facility];  Service: General;  Laterality: Left;   MASTECTOMY, PARTIAL Left 07/19/2020   Procedure: MASTECTOMY PARTIAL;  Surgeon: Aviva Signs, MD;  Location: [facility];  Service: General;  Laterality: Left;   TONSILLECTOMY     TOTAL MASTECTOMY Left    TUBAL LIGATION      Family History  Problem Relation Age of Onset   Skin cancer Brother    Skin cancer Paternal Uncle        nose   Heart attack Maternal Grandmother    Heart attack Maternal Grandfather    Stroke Paternal Grandfather    Skin cancer Brother    Cancer Paternal Uncle        unknown type    Current Outpatient Medications on File Prior to Visit  Medication Sig Dispense Refill   albuterol (VENTOLIN HFA) 108 (90 Base)  MCG/ACT inhaler Inhale into the lungs every 4 (four) hours as needed for wheezing or shortness of breath.     ALPRAZolam (XANAX) 1 MG tablet Take 1 mg by mouth 4 (four) times daily as needed for anxiety.  5   ascorbic acid (VITAMIN C) 500 MG tablet Take 500 mg by mouth daily.     cholecalciferol (VITAMIN D3) 25 MCG (1000 UNIT) tablet Take 1,000 Units by mouth in the morning and at bedtime.     HYDROcodone-acetaminophen (NORCO) 10-325 MG tablet Take 1 tablet by mouth every 4 (four) hours as needed for severe pain.  0   ipratropium-albuterol (DUONEB) 0.5-2.5 (3) MG/3ML SOLN Inhale 3 mLs into the lungs every 6 (six) hours as needed (asthma).  11   loperamide (IMODIUM A-D) 2 MG tablet Take 2 mg by mouth 4 (four) times daily as needed for diarrhea or loose stools.     magnesium oxide (MAG-OX) 400 MG tablet Take 400 mg by mouth 2 (two) times daily.     OVER THE COUNTER MEDICATION Take 1 Scoop by mouth in the morning and at bedtime. Super Beets     SYMBICORT 80-4.5 MCG/ACT inhaler SMARTSIG:2 Puff(s) By Mouth Twice Daily     tamoxifen (NOLVADEX) 20 MG tablet TAKE 1 TABLET(20 MG) BY MOUTH DAILY (Patient  not taking: Reported on 09/23/2021) 90 tablet 0   No current facility-administered medications on file prior to visit.    Allergies  Allergen Reactions   Ciprofloxacin Anxiety   Aspirin     Stomach pains    Doxycycline     Made pt feel crazy    Nsaids     Stomach pains     Social History   Substance and Sexual Activity  Alcohol Use No    Social History   Tobacco Use  Smoking Status Every Day   Packs/day: 1.00   Types: Cigarettes  Smokeless Tobacco Never    Review of Systems  Constitutional: Negative.   HENT: Negative.    Eyes: Negative.   Respiratory: Negative.    Cardiovascular: Negative.   Gastrointestinal: Negative.   Genitourinary: Negative.   Musculoskeletal: Negative.   Skin: Negative.   Neurological: Negative.   Endo/Heme/Allergies: Negative.   Psychiatric/Behavioral:  Negative.      Objective   Vitals:   10/21/21 1448  BP: 126/71  Pulse: 80  Resp: 12  Temp: 98.1 F (36.7 C)  SpO2: 91%    Physical Exam Vitals reviewed.  Constitutional:      Appearance: Normal appearance. She is normal weight. She is not ill-appearing.  HENT:     Head: Normocephalic and atraumatic.  Cardiovascular:     Rate and Rhythm: Normal rate and regular rhythm.     Heart sounds: Normal heart sounds. No murmur heard.    No friction rub. No gallop.  Pulmonary:     Effort: Pulmonary effort is normal. No respiratory distress.     Breath sounds: Normal breath sounds. No stridor. No wheezing, rhonchi or rales.  Skin:    General: Skin is warm and dry.  Neurological:     Mental Status: She is alert and oriented to person, place, and time.   Breast: No dominant mass, nipple discharge, or dimpling in the right breast.  Patient is status post a mastectomy on the left.  The surgical scar is well-healed.  There are 2 hard somewhat fixed nodules approximately 1-1/2 cm in greatest diameter along the midportion of the surgical incision site with retraction of the skin.  The axilla is negative for palpable nodes.  Oncology notes reviewed  Assessment  History of recurrent left breast carcinoma, new nodules noted at mastectomy site.  Concerning for recurrence. Plan  Patient is scheduled for left breast biopsy on 11/10/2021.  The risks and benefits of the procedure including bleeding, infection, and the possibility of recurrence of the breast cancer were fully explained to the patient, who gave informed consent.

## 2021-10-22 NOTE — Progress Notes (Signed)
Virginia Blake; [id number]; [birth year]   HPI Patient is a 71 year old white female who was referred back to my care by Dr. Delton Coombes of oncology for biopsy of recurrent nodules along her surgical incision site.  She is status post left mastectomy with recurrence along the chest wall in the past.  She recently noted to tender nodules along the chest wall just beneath the surgical scar.  I have removed some of these in the past that have been positive for recurrent cancer. Past Medical History:  Diagnosis Date   Breast cancer ([place])    left mastectomy  2017   Chronic fatigue syndrome    Complication of anesthesia    history of difficulty waking up after surgery   COPD (chronic obstructive pulmonary disease) (HCC)    Family history of skin cancer    Fibromyalgia    Hx of skin cancer, basal cell    Right posterior upper arm   Osteoporosis     Past Surgical History:  Procedure Laterality Date   ABDOMINAL HYSTERECTOMY  1998   INCONTINENCE SURGERY     MASTECTOMY, PARTIAL Left 02/16/2020   Procedure: MASTECTOMY PARTIAL;  Surgeon: Aviva Signs, MD;  Location: [facility];  Service: General;  Laterality: Left;   MASTECTOMY, PARTIAL Left 05/10/2020   Procedure: MASTECTOMY PARTIAL;  Surgeon: Aviva Signs, MD;  Location: [facility];  Service: General;  Laterality: Left;   MASTECTOMY, PARTIAL Left 07/19/2020   Procedure: MASTECTOMY PARTIAL;  Surgeon: Aviva Signs, MD;  Location: [facility];  Service: General;  Laterality: Left;   TONSILLECTOMY     TOTAL MASTECTOMY Left    TUBAL LIGATION      Family History  Problem Relation Age of Onset   Skin cancer Brother    Skin cancer Paternal Uncle        nose   Heart attack Maternal Grandmother    Heart attack Maternal Grandfather    Stroke Paternal Grandfather    Skin cancer Brother    Cancer Paternal Uncle        unknown type    Current Outpatient Medications on File Prior to Visit  Medication Sig Dispense Refill   albuterol (VENTOLIN HFA) 108 (90 Base)  MCG/ACT inhaler Inhale into the lungs every 4 (four) hours as needed for wheezing or shortness of breath.     ALPRAZolam (XANAX) 1 MG tablet Take 1 mg by mouth 4 (four) times daily as needed for anxiety.  5   ascorbic acid (VITAMIN C) 500 MG tablet Take 500 mg by mouth daily.     cholecalciferol (VITAMIN D3) 25 MCG (1000 UNIT) tablet Take 1,000 Units by mouth in the morning and at bedtime.     HYDROcodone-acetaminophen (NORCO) 10-325 MG tablet Take 1 tablet by mouth every 4 (four) hours as needed for severe pain.  0   ipratropium-albuterol (DUONEB) 0.5-2.5 (3) MG/3ML SOLN Inhale 3 mLs into the lungs every 6 (six) hours as needed (asthma).  11   loperamide (IMODIUM A-D) 2 MG tablet Take 2 mg by mouth 4 (four) times daily as needed for diarrhea or loose stools.     magnesium oxide (MAG-OX) 400 MG tablet Take 400 mg by mouth 2 (two) times daily.     OVER THE COUNTER MEDICATION Take 1 Scoop by mouth in the morning and at bedtime. Super Beets     SYMBICORT 80-4.5 MCG/ACT inhaler SMARTSIG:2 Puff(s) By Mouth Twice Daily     tamoxifen (NOLVADEX) 20 MG tablet TAKE 1 TABLET(20 MG) BY MOUTH DAILY (Patient  not taking: Reported on 09/23/2021) 90 tablet 0   No current facility-administered medications on file prior to visit.    Allergies  Allergen Reactions   Ciprofloxacin Anxiety   Aspirin     Stomach pains    Doxycycline     Made pt feel crazy    Nsaids     Stomach pains     Social History   Substance and Sexual Activity  Alcohol Use No    Social History   Tobacco Use  Smoking Status Every Day   Packs/day: 1.00   Types: Cigarettes  Smokeless Tobacco Never    Review of Systems  Constitutional: Negative.   HENT: Negative.    Eyes: Negative.   Respiratory: Negative.    Cardiovascular: Negative.   Gastrointestinal: Negative.   Genitourinary: Negative.   Musculoskeletal: Negative.   Skin: Negative.   Neurological: Negative.   Endo/Heme/Allergies: Negative.   Psychiatric/Behavioral:  Negative.      Objective   Vitals:   10/21/21 1448  BP: 126/71  Pulse: 80  Resp: 12  Temp: 98.1 F (36.7 C)  SpO2: 91%    Physical Exam Vitals reviewed.  Constitutional:      Appearance: Normal appearance. She is normal weight. She is not ill-appearing.  HENT:     Head: Normocephalic and atraumatic.  Cardiovascular:     Rate and Rhythm: Normal rate and regular rhythm.     Heart sounds: Normal heart sounds. No murmur heard.    No friction rub. No gallop.  Pulmonary:     Effort: Pulmonary effort is normal. No respiratory distress.     Breath sounds: Normal breath sounds. No stridor. No wheezing, rhonchi or rales.  Skin:    General: Skin is warm and dry.  Neurological:     Mental Status: She is alert and oriented to person, place, and time.   Breast: No dominant mass, nipple discharge, or dimpling in the right breast.  Patient is status post a mastectomy on the left.  The surgical scar is well-healed.  There are 2 hard somewhat fixed nodules approximately 1-1/2 cm in greatest diameter along the midportion of the surgical incision site with retraction of the skin.  The axilla is negative for palpable nodes.  Oncology notes reviewed  Assessment  History of recurrent left breast carcinoma, new nodules noted at mastectomy site.  Concerning for recurrence. Plan  Patient is scheduled for left breast biopsy on 11/10/2021.  The risks and benefits of the procedure including bleeding, infection, and the possibility of recurrence of the breast cancer were fully explained to the patient, who gave informed consent.

## 2021-10-28 ENCOUNTER — Inpatient Hospital Stay: Payer: Medicare PPO | Attending: Hematology | Admitting: Hematology

## 2021-10-28 VITALS — BP 143/45 | HR 81 | Temp 98.5°F | Resp 18 | Wt 118.0 lb

## 2021-10-28 DIAGNOSIS — C50412 Malignant neoplasm of upper-outer quadrant of left female breast: Secondary | ICD-10-CM

## 2021-10-28 DIAGNOSIS — M810 Age-related osteoporosis without current pathological fracture: Secondary | ICD-10-CM | POA: Insufficient documentation

## 2021-10-28 DIAGNOSIS — R229 Localized swelling, mass and lump, unspecified: Secondary | ICD-10-CM | POA: Diagnosis not present

## 2021-10-28 DIAGNOSIS — Z170 Estrogen receptor positive status [ER+]: Secondary | ICD-10-CM | POA: Insufficient documentation

## 2021-10-28 DIAGNOSIS — C50912 Malignant neoplasm of unspecified site of left female breast: Secondary | ICD-10-CM | POA: Diagnosis not present

## 2021-10-28 NOTE — Patient Instructions (Addendum)
[place] at [facility] Discharge Instructions   You were seen and examined today by Dr. Delton Coombes.  He discussed with your that Dr. Arnoldo Morale is planning to biopsy the areas of the mastectomy site on the left.   We will see you back after the biopsy to discuss the results in about 4-6 weeks.    Thank you for choosing [facility] to provide your oncology and hematology care.  To afford each patient quality time with our provider, please arrive at least 15 minutes before your scheduled appointment time.   If you have a lab appointment with the [facility] please come in thru the Main Entrance and check in at the main information desk.  You need to re-schedule your appointment should you arrive 10 or more minutes late.  We strive to give you quality time with our providers, and arriving late affects you and other patients whose appointments are after yours.  Also, if you no show three or more times for appointments you may be dismissed from the clinic at the providers discretion.     Again, thank you for choosing [facility].  Our hope is that these requests will decrease the amount of time that you wait before being seen by our physicians.       _____________________________________________________________  Should you have questions after your visit to [facility], please contact our office at [phone] and follow the prompts.  Our office hours are 8:00 a.m. and 4:30 p.m. Monday - Friday.  Please note that voicemails left after 4:00 p.m. may not be returned until the following business day.  We are closed weekends and major holidays.  You do have access to a nurse 24-7, just call the main number to the clinic [phone] and do not press any options, hold on the line and a nurse will answer the phone.    For prescription refill requests, have your pharmacy contact our office and allow 72 hours.    Due  to Covid, you will need to wear a mask upon entering the hospital. If you do not have a mask, a mask will be given to you at the Main Entrance upon arrival. For doctor visits, patients may have 1 support person age 70 or older with them. For treatment visits, patients can not have anyone with them due to social distancing guidelines and our immunocompromised population.

## 2021-10-28 NOTE — Progress Notes (Signed)
 [facility] [address] [place], [place] [address]   Patient Care Team: Hasanaj, Xaje A, MD as PCP - General (Internal Medicine) Edwards, Morgan P, RN as Oncology Nurse Navigator (Oncology) Katragadda, Sreedhar, MD as Medical Oncologist (Medical Oncology)  SUMMARY OF ONCOLOGIC HISTORY: Oncology History   No history exists.    CHIEF COMPLIANT: Follow-up for left breast cancer   INTERVAL HISTORY: [name] is a 71 y.o. female seen for follow-up of left breast cancer.  She took tamoxifen for approximately 2 months and stopped taking it due to arthralgias.  She recently noted to have small nodules in the left mastectomy site.  She was evaluated by Dr. Jenkins on 10/21/2021.  She is being scheduled for resection of the nodules.  REVIEW OF SYSTEMS:   Review of Systems  Respiratory:  Positive for cough and shortness of breath.   Neurological:  Positive for dizziness.  Psychiatric/Behavioral:  The patient is nervous/anxious.   All other systems reviewed and are negative.   I have reviewed the past medical history, past surgical history, social history and family history with the patient and they are unchanged from previous note.   ALLERGIES:   is allergic to ciprofloxacin, aspirin, doxycycline, and nsaids.   MEDICATIONS:  Current Outpatient Medications  Medication Sig Dispense Refill   albuterol (VENTOLIN HFA) 108 (90 Base) MCG/ACT inhaler Inhale into the lungs every 4 (four) hours as needed for wheezing or shortness of breath.     ALPRAZolam (XANAX) 1 MG tablet Take 1 mg by mouth 4 (four) times daily as needed for anxiety.  5   ascorbic acid (VITAMIN C) 500 MG tablet Take 500 mg by mouth daily.     cholecalciferol (VITAMIN D3) 25 MCG (1000 UNIT) tablet Take 1,000 Units by mouth in the morning and at bedtime.     HYDROcodone-acetaminophen (NORCO) 10-325 MG tablet Take 1 tablet by mouth every 4 (four) hours as needed for severe pain.  0   ipratropium-albuterol  (DUONEB) 0.5-2.5 (3) MG/3ML SOLN Inhale 3 mLs into the lungs every 6 (six) hours as needed (asthma).  11   loperamide (IMODIUM A-D) 2 MG tablet Take 2 mg by mouth 4 (four) times daily as needed for diarrhea or loose stools.     magnesium oxide (MAG-OX) 400 MG tablet Take 400 mg by mouth 2 (two) times daily.     OVER THE COUNTER MEDICATION Take 1 Scoop by mouth in the morning and at bedtime. Super Beets     SYMBICORT 80-4.5 MCG/ACT inhaler SMARTSIG:2 Puff(s) By Mouth Twice Daily     tamoxifen (NOLVADEX) 20 MG tablet TAKE 1 TABLET(20 MG) BY MOUTH DAILY (Patient not taking: Reported on 09/23/2021) 90 tablet 0   No current facility-administered medications for this visit.     PHYSICAL EXAMINATION: Performance status (ECOG): 1 - Symptomatic but completely ambulatory  There were no vitals filed for this visit.  Wt Readings from Last 3 Encounters:  10/21/21 117 lb (53.1 kg)  05/06/21 120 lb 3.2 oz (54.5 kg)  02/26/21 126 lb 6.4 oz (57.3 kg)   Physical Exam Vitals reviewed.  Constitutional:      Appearance: Normal appearance.  Cardiovascular:     Rate and Rhythm: Normal rate and regular rhythm.     Pulses: Normal pulses.     Heart sounds: Normal heart sounds.  Pulmonary:     Effort: Pulmonary effort is normal.     Breath sounds: Normal breath sounds.  Chest:  Breasts:      Left: Absent. No mass or skin change (mastectomy site WNL).  Neurological:     General: No focal deficit present.     Mental Status: She is alert and oriented to person, place, and time.  Psychiatric:        Mood and Affect: Mood normal.        Behavior: Behavior normal.       LABORATORY DATA:  I have reviewed the data as listed    Latest Ref Rng & Units 09/02/2021   11:16 AM 04/24/2021    1:29 PM 02/19/2021   10:54 AM  CMP  Glucose 70 - 99 mg/dL 107  105  113   BUN 8 - 23 mg/dL 9  11  10   Creatinine 0.44 - 1.00 mg/dL 0.54  0.66  0.63   Sodium 135 - 145 mmol/L 140  139  136   Potassium 3.5 - 5.1 mmol/L 4.8   5.1  4.2   Chloride 98 - 111 mmol/L 100  96  95   CO2 22 - 32 mmol/L 33  36  36   Calcium 8.9 - 10.3 mg/dL 9.3  9.8  9.2   Total Protein 6.5 - 8.1 g/dL 7.1  7.3  7.5   Total Bilirubin 0.3 - 1.2 mg/dL 0.4  0.5  0.4   Alkaline Phos 38 - 126 U/L 52  53  58   AST 15 - 41 U/L 21  25  24   ALT 0 - 44 U/L 13  16  12    Lab Results  Component Value Date   CAN153 15.1 11/30/2019   Lab Results  Component Value Date   WBC 5.2 09/02/2021   HGB 13.1 09/02/2021   HCT 42.0 09/02/2021   MCV 99.5 09/02/2021   PLT 231 09/02/2021   NEUTROABS 2.2 09/02/2021    ASSESSMENT:  1.  Recurrent left breast cancer: -Patient recently noticed a nodule in the left breast for the past several months. -As it was growing, she had mammogram right breast and ultrasound guided biopsy of the left breast on 11/06/2019 at [facility] [place]. -Pathology on 11/01/2019 consistent with grade 2 invasive ductal carcinoma, ER 95%, PR 0%, Ki-67 10%, HER-2 2+ by IHC, negative by FISH. -PET scan on 01/08/2020 with tiny soft tissue nodule in the medial aspect of the left mastectomy bed with low-level FDG accumulation compatible with biopsy-proven malignancy with no evidence of metastatic disease.  Tiny lung nodules nonspecific.   2.  Left breast cancer (stage Ia): -Status post left mastectomy and sentinel lymph node biopsy on 02/05/2015 by Dr. DeMason in [place]. -Pathology shows 6 mm, grade 1, positive lymphovascular invasion, single focus, margins negative, ER more than 90%, PR 10%, Ki-67 26%, HER-2 2+, negative by FISH. -She did not take antiestrogen therapy as she was afraid of the side effects.  -Excision of the left breast nodule on 02/16/2020 by Dr. Jenkins. 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  Infiltrating ductal carcinoma involving superior and inferior margins and medial margin.  Tumor measures 1 x 0.8 x 0.8 cm.  ER was 95% positive, PR negative, HER-2 2+ and  negative by FISH.  Ki-67 10%. - Reexcision of the left breast tissue shows grade 2 IDC on 05/10/2020.  Invasive carcinoma present at the inferior medial margin focally and less than 1 mm from the deep margin focally. - Left breast tissue excision on 07/19/2020 with no residual malignancy. - She took anastrozole for 10   days and stopped taking due to aching pains in the arms and bones. - She tried tamoxifen for few weeks in April 2023.  She has experienced body pains and stopped taking them in June 2023.   3.  Social/family history: -Lives at home with ex-husband.  She used to do office work. -Current active smoker 1-2 packs/day for 13 years. -Paternal uncle had cancer, type unknown to the patient.   4.  Osteoporosis: -Last bone density on 10/28/2016 shows T score -2.9. -She was not treated for osteoporosis. -DEXA scan on 11/20/2019 with T score -3.2.   PLAN:  1.  Recurrent left breast cancer, ER positive, PR and HER-2 negative: - She took tamoxifen for couple of months and stopped taking them due to body pains. - At last virtual visit on 09/21/2021, she reported that she felt a couple of knots on the left chest wall at the mastectomy site.  She could not come to her office because of upper respiratory infection and cough.  She was evaluated by Dr. Arnoldo Morale on 10/21/2021 and a biopsy is being planned on 11/10/2021. - Reviewed labs from 09/02/2021 which showed normal LFTs and creatinine.  CBC was grossly normal. - Mammogram of the right breast on 04/24/2021 was BI-RADS Category 1. - Left mastectomy site shows very small nodules. - Proceed with biopsy/resection of the nodules on 11/10/2021.  RTC 4 to 6 weeks for follow-up.   2.  Osteoporosis: - Continue calcium and vitamin D supplements.  We have previously discussed about starting her on Prolia after dental evaluation. - Current labs show vitamin D is normal.  3.  Smoking history: -CT lung cancer scan from 04/07/2021.  Lung RADS 2S.  She has a  40-pack-year smoker.  We will repeat imaging in 1 year.  Breast Cancer therapy associated bone loss: I have recommended calcium, Vitamin D and weight bearing exercises.  Orders placed this encounter:  No orders of the defined types were placed in this encounter.   The patient has a good understanding of the overall plan. She agrees with it. She will call with any problems that may develop before the next visit here.  Derek Jack, MD [place] [phone]

## 2021-11-04 NOTE — Patient Instructions (Signed)
Virginia Blake  11/04/2021     '@PREFPERIOPPHARMACY'$ @   Your procedure is scheduled on  11/10/2021.   Report to [facility] at  0700  A.M.   Call this number if you have problems the morning of surgery:  [phone]  If you experience any cold or flu symptoms such as cough, fever, chills, shortness of breath, etc. between now and your scheduled surgery, please notify us at the above number.   Remember:  Do not eat or drink after midnight.         Use your nebulizer and your inhaler before you come and bring your rescue inhaler with you.     Take these medicines the morning of surgery with A SIP OF WATER                           Xanax(if needed), hydrocodone (if needed).     Do not wear jewelry, make-up or nail polish.  Do not wear lotions, powders, or perfumes, or deodorant.  Do not shave 48 hours prior to surgery.  Men may shave face and neck.  Do not bring valuables to the hospital.  [facility] is not responsible for any belongings or valuables.  Contacts, dentures or bridgework may not be worn into surgery.  Leave your suitcase in the car.  After surgery it may be brought to your room.  For patients admitted to the hospital, discharge time will be determined by your treatment team.  Patients discharged the day of surgery will not be allowed to drive home and must have someone with them for 24 hours.    Special instructions:   DO NOT smoke tobacco or vape for 24 hours before your procedure.   Please read over the following fact sheets that you were given. Coughing and Deep Breathing, Surgical Site Infection Prevention, Anesthesia Post-op Instructions, and Care and Recovery After Surgery      Breast Biopsy, Care After The following information offers guidance on how to care for yourself after your breast biopsy. Your doctor may also give you more specific instructions. If you have problems or questions, contact your doctor. What can I expect after the  procedure? After a breast biopsy, it is common to have: Bruising on your breast. Breast swelling. Numbness, tingling, or pain near your biopsy site. This site is where tissue was taken out for study. Follow these instructions at home: Medicines Take over-the-counter and prescription medicines only as told by your doctor. If you were given a sedative during your procedure, do not drive or use machines until your doctor says that it is safe. A sedative is a medicine that helps you relax. Do not drink alcohol while taking pain medicine. Ask your doctor if you should avoid driving or using machines while you are taking your medicine. Biopsy site care     Follow instructions from your doctor about how to take care of your cut from surgery (incision) or your puncture site. Make sure you: Wash your hands with soap and water for at least 20 seconds before and after you change your bandage. If you cannot use soap and water, use hand sanitizer. Change your bandage. Leave stitches or skin glue in place for at least 2 weeks. Leave tape strips alone unless you are told to take them off. You may trim the edges of the tape strips if they curl up. If you have stitches, keep them  dry when you take a bath or a shower. Check your cut or puncture site every day for signs of infection. Look for: More redness, swelling, or pain. More fluid or blood. Warmth. Pus or a bad smell. Protect the biopsy site. Do not let the site get bumped. Managing pain If told, put ice on the biopsy site. To do this: Put ice in a plastic bag. Place a towel between your skin and the bag. Leave the ice on for 20 minutes, 2-3 times a day. Take off the ice if your skin turns bright red. This is very important. If you cannot feel pain, heat, or cold, you have a greater risk of damage to the area. Activity If a cut was made in your skin to do the biopsy, avoid activities that could pull your cut open. These  include: Stretching. Reaching over your head. Exercise. Sports. Lifting anything that weighs more than 3 lb (1.4 kg). Return to your normal activities when your doctor says that it is safe. General instructions Follow your normal diet. Wear a good support bra for as long as told by your doctor. Get checked for extra fluid around your lymph nodes (lymphedema) as often as told. Do not smoke or use any products that contain nicotine or tobacco. If you need help quitting, ask your doctor. Keep all follow-up visits. Contact a doctor if: You notice any of these at or near the biopsy site: More redness, swelling, or pain. More fluid or blood. Warmth. Pus or a bad smell. The site breaking open after the stitches or skin tape strips have been removed. You have a rash or a fever. Get help right away if: You have trouble breathing. You have red streaks around the biopsy site. Summary After a breast biopsy, it is common to have bruising, numbness, tingling, or pain near your biopsy site. Ask your doctor if you should avoid driving or using machines while you are taking your medicine. If you had a cut made in your skin to do the biopsy, avoid activities that may pull the cut open. Return to your normal activities when your doctor says that it is safe. Wear a good support bra for as long as told by your doctor. This information is not intended to replace advice given to you by your health care provider. Make sure you discuss any questions you have with your health care provider. Document Revised: 10/30/2020 Document Reviewed: 10/30/2020 Elsevier Patient Education  [place] Anesthesia, Adult, Care After The following information offers guidance on how to care for yourself after your procedure. Your health care provider may also give you more specific instructions. If you have problems or questions, contact your health care provider. What can I expect after the procedure? After  the procedure, it is common for people to: Have pain or discomfort at the IV site. Have nausea or vomiting. Have a sore throat or hoarseness. Have trouble concentrating. Feel cold or chills. Feel weak, sleepy, or tired (fatigue). Have soreness and body aches. These can affect parts of the body that were not involved in surgery. Follow these instructions at home: For the time period you were told by your health care provider:  Rest. Do not participate in activities where you could fall or become injured. Do not drive or use machinery. Do not drink alcohol. Do not take sleeping pills or medicines that cause drowsiness. Do not make important decisions or sign legal documents. Do not take care of children on your own.  General instructions Drink enough fluid to keep your urine pale yellow. If you have sleep apnea, surgery and certain medicines can increase your risk for breathing problems. Follow instructions from your health care provider about wearing your sleep device: Anytime you are sleeping, including during daytime naps. While taking prescription pain medicines, sleeping medicines, or medicines that make you drowsy. Return to your normal activities as told by your health care provider. Ask your health care provider what activities are safe for you. Take over-the-counter and prescription medicines only as told by your health care provider. Do not use any products that contain nicotine or tobacco. These products include cigarettes, chewing tobacco, and vaping devices, such as e-cigarettes. These can delay incision healing after surgery. If you need help quitting, ask your health care provider. Contact a health care provider if: You have nausea or vomiting that does not get better with medicine. You vomit every time you eat or drink. You have pain that does not get better with medicine. You cannot urinate or have bloody urine. You develop a skin rash. You have a fever. Get help right  away if: You have trouble breathing. You have chest pain. You vomit blood. These symptoms may be an emergency. Get help right away. Call 911. Do not wait to see if the symptoms will go away. Do not drive yourself to the hospital. Summary After the procedure, it is common to have a sore throat, hoarseness, nausea, vomiting, or to feel weak, sleepy, or fatigue. For the time period you were told by your health care provider, do not drive or use machinery. Get help right away if you have difficulty breathing, have chest pain, or vomit blood. These symptoms may be an emergency. This information is not intended to replace advice given to you by your health care provider. Make sure you discuss any questions you have with your health care provider. Document Revised: 04/04/2021 Document Reviewed: 04/04/2021 Elsevier Patient Education  [place]. How to Use Chlorhexidine Before Surgery Chlorhexidine gluconate (CHG) is a germ-killing (antiseptic) solution that is used to clean the skin. It can get rid of the bacteria that normally live on the skin and can keep them away for about 24 hours. To clean your skin with CHG, you may be given: A CHG solution to use in the shower or as part of a sponge bath. A prepackaged cloth that contains CHG. Cleaning your skin with CHG may help lower the risk for infection: While you are staying in the intensive care unit of the hospital. If you have a vascular access, such as a central line, to provide short-term or long-term access to your veins. If you have a catheter to drain urine from your bladder. If you are on a ventilator. A ventilator is a machine that helps you breathe by moving air in and out of your lungs. After surgery. What are the risks? Risks of using CHG include: A skin reaction. Hearing loss, if CHG gets in your ears and you have a perforated eardrum. Eye injury, if CHG gets in your eyes and is not rinsed out. The CHG product catching  fire. Make sure that you avoid smoking and flames after applying CHG to your skin. Do not use CHG: If you have a chlorhexidine allergy or have previously reacted to chlorhexidine. On babies younger than 3 months of age. How to use CHG solution Use CHG only as told by your health care provider, and follow the instructions on the label. Use the full  amount of CHG as directed. Usually, this is one bottle. During a shower Follow these steps when using CHG solution during a shower (unless your health care provider gives you different instructions): Start the shower. Use your normal soap and shampoo to wash your face and hair. Turn off the shower or move out of the shower stream. Pour the CHG onto a clean washcloth. Do not use any type of brush or rough-edged sponge. Starting at your neck, lather your body down to your toes. Make sure you follow these instructions: If you will be having surgery, pay special attention to the part of your body where you will be having surgery. Scrub this area for at least 1 minute. Do not use CHG on your head or face. If the solution gets into your ears or eyes, rinse them well with water. Avoid your genital area. Avoid any areas of skin that have broken skin, cuts, or scrapes. Scrub your back and under your arms. Make sure to wash skin folds. Let the lather sit on your skin for 1-2 minutes or as long as told by your health care provider. Thoroughly rinse your entire body in the shower. Make sure that all body creases and crevices are rinsed well. Dry off with a clean towel. Do not put any substances on your body afterward--such as powder, lotion, or perfume--unless you are told to do so by your health care provider. Only use lotions that are recommended by the manufacturer. Put on clean clothes or pajamas. If it is the night before your surgery, sleep in clean sheets.  During a sponge bath Follow these steps when using CHG solution during a sponge bath (unless  your health care provider gives you different instructions): Use your normal soap and shampoo to wash your face and hair. Pour the CHG onto a clean washcloth. Starting at your neck, lather your body down to your toes. Make sure you follow these instructions: If you will be having surgery, pay special attention to the part of your body where you will be having surgery. Scrub this area for at least 1 minute. Do not use CHG on your head or face. If the solution gets into your ears or eyes, rinse them well with water. Avoid your genital area. Avoid any areas of skin that have broken skin, cuts, or scrapes. Scrub your back and under your arms. Make sure to wash skin folds. Let the lather sit on your skin for 1-2 minutes or as long as told by your health care provider. Using a different clean, wet washcloth, thoroughly rinse your entire body. Make sure that all body creases and crevices are rinsed well. Dry off with a clean towel. Do not put any substances on your body afterward--such as powder, lotion, or perfume--unless you are told to do so by your health care provider. Only use lotions that are recommended by the manufacturer. Put on clean clothes or pajamas. If it is the night before your surgery, sleep in clean sheets. How to use CHG prepackaged cloths Only use CHG cloths as told by your health care provider, and follow the instructions on the label. Use the CHG cloth on clean, dry skin. Do not use the CHG cloth on your head or face unless your health care provider tells you to. When washing with the CHG cloth: Avoid your genital area. Avoid any areas of skin that have broken skin, cuts, or scrapes. Before surgery Follow these steps when using a CHG cloth to clean before surgery (  unless your health care provider gives you different instructions): Using the CHG cloth, vigorously scrub the part of your body where you will be having surgery. Scrub using a back-and-forth motion for 3 minutes. The  area on your body should be completely wet with CHG when you are done scrubbing. Do not rinse. Discard the cloth and let the area air-dry. Do not put any substances on the area afterward, such as powder, lotion, or perfume. Put on clean clothes or pajamas. If it is the night before your surgery, sleep in clean sheets.  For general bathing Follow these steps when using CHG cloths for general bathing (unless your health care provider gives you different instructions). Use a separate CHG cloth for each area of your body. Make sure you wash between any folds of skin and between your fingers and toes. Wash your body in the following order, switching to a new cloth after each step: The front of your neck, shoulders, and chest. Both of your arms, under your arms, and your hands. Your stomach and groin area, avoiding the genitals. Your right leg and foot. Your left leg and foot. The back of your neck, your back, and your buttocks. Do not rinse. Discard the cloth and let the area air-dry. Do not put any substances on your body afterward--such as powder, lotion, or perfume--unless you are told to do so by your health care provider. Only use lotions that are recommended by the manufacturer. Put on clean clothes or pajamas. Contact a health care provider if: Your skin gets irritated after scrubbing. You have questions about using your solution or cloth. You swallow any chlorhexidine. Call your local poison control center (1-331-595-1437 in the [place] Get help right away if: Your eyes itch badly, or they become very red or swollen. Your skin itches badly and is red or swollen. Your hearing changes. You have trouble seeing. You have swelling or tingling in your mouth or throat. You have trouble breathing. These symptoms may represent a serious problem that is an emergency. Do not wait to see if the symptoms will go away. Get medical help right away. Call your local emergency services (911 in the [place]  Do not drive yourself to the hospital. Summary Chlorhexidine gluconate (CHG) is a germ-killing (antiseptic) solution that is used to clean the skin. Cleaning your skin with CHG may help to lower your risk for infection. You may be given CHG to use for bathing. It may be in a bottle or in a prepackaged cloth to use on your skin. Carefully follow your health care provider's instructions and the instructions on the product label. Do not use CHG if you have a chlorhexidine allergy. Contact your health care provider if your skin gets irritated after scrubbing. This information is not intended to replace advice given to you by your health care provider. Make sure you discuss any questions you have with your health care provider. Document Revised: 05/05/2021 Document Reviewed: 03/18/2020 Elsevier Patient Education  [place].

## 2021-11-05 ENCOUNTER — Other Ambulatory Visit: Payer: Self-pay

## 2021-11-05 ENCOUNTER — Encounter (HOSPITAL_COMMUNITY)
Admission: RE | Admit: 2021-11-05 | Discharge: 2021-11-05 | Disposition: A | Discharge status: Home / Self Care | Payer: Medicare PPO | Source: Ambulatory Visit | Attending: General Surgery | Admitting: General Surgery

## 2021-11-05 ENCOUNTER — Encounter (HOSPITAL_COMMUNITY): Payer: Self-pay

## 2021-11-05 VITALS — BP 139/39 | HR 73 | Temp 98.5°F | Resp 18 | Ht 59.0 in | Wt 118.0 lb

## 2021-11-05 DIAGNOSIS — Z01818 Encounter for other preprocedural examination: Secondary | ICD-10-CM | POA: Insufficient documentation

## 2021-11-05 DIAGNOSIS — F17200 Nicotine dependence, unspecified, uncomplicated: Secondary | ICD-10-CM | POA: Diagnosis not present

## 2021-11-10 ENCOUNTER — Encounter (HOSPITAL_COMMUNITY): Payer: Self-pay | Admitting: General Surgery

## 2021-11-10 ENCOUNTER — Ambulatory Visit (HOSPITAL_BASED_OUTPATIENT_CLINIC_OR_DEPARTMENT_OTHER): Payer: Medicare PPO

## 2021-11-10 ENCOUNTER — Encounter (HOSPITAL_COMMUNITY)
Admission: RE | Disposition: A | Discharge status: Home / Self Care | Payer: Self-pay | Source: Home / Self Care | Attending: General Surgery

## 2021-11-10 ENCOUNTER — Ambulatory Visit (HOSPITAL_COMMUNITY): Payer: Medicare PPO

## 2021-11-10 ENCOUNTER — Ambulatory Visit (HOSPITAL_COMMUNITY)
Admission: RE | Admit: 2021-11-10 | Discharge: 2021-11-10 | Disposition: A | Discharge status: Home / Self Care | Payer: Medicare PPO | Attending: General Surgery | Admitting: General Surgery

## 2021-11-10 DIAGNOSIS — R238 Other skin changes: Secondary | ICD-10-CM | POA: Diagnosis not present

## 2021-11-10 DIAGNOSIS — C50912 Malignant neoplasm of unspecified site of left female breast: Secondary | ICD-10-CM

## 2021-11-10 DIAGNOSIS — J449 Chronic obstructive pulmonary disease, unspecified: Secondary | ICD-10-CM | POA: Insufficient documentation

## 2021-11-10 DIAGNOSIS — F17210 Nicotine dependence, cigarettes, uncomplicated: Secondary | ICD-10-CM | POA: Insufficient documentation

## 2021-11-10 DIAGNOSIS — F418 Other specified anxiety disorders: Secondary | ICD-10-CM | POA: Diagnosis not present

## 2021-11-10 DIAGNOSIS — Z9012 Acquired absence of left breast and nipple: Secondary | ICD-10-CM | POA: Insufficient documentation

## 2021-11-10 DIAGNOSIS — F17200 Nicotine dependence, unspecified, uncomplicated: Secondary | ICD-10-CM

## 2021-11-10 DIAGNOSIS — C50812 Malignant neoplasm of overlapping sites of left female breast: Secondary | ICD-10-CM | POA: Insufficient documentation

## 2021-11-10 HISTORY — PX: EXCISION OF BREAST BIOPSY: SHX5822

## 2021-11-10 SURGERY — EXCISION OF BREAST BIOPSY
Anesthesia: General | Site: Breast | Laterality: Left

## 2021-11-10 MED ORDER — FENTANYL CITRATE PF 50 MCG/ML IJ SOSY
25.0000 ug | PREFILLED_SYRINGE | INTRAMUSCULAR | Status: DC | PRN
  Administered 2021-11-10 (×2): 50 ug via INTRAVENOUS
  Filled 2021-11-10: qty 1

## 2021-11-10 MED ORDER — PROPOFOL 10 MG/ML IV BOLUS
INTRAVENOUS | Status: DC | PRN
  Administered 2021-11-10: 100 mg via INTRAVENOUS
  Administered 2021-11-10: 50 mg via INTRAVENOUS

## 2021-11-10 MED ORDER — LIDOCAINE HCL (CARDIAC) PF 100 MG/5ML IV SOSY
PREFILLED_SYRINGE | INTRAVENOUS | Status: DC | PRN
  Administered 2021-11-10: 50 mg via INTRATRACHEAL

## 2021-11-10 MED ORDER — PROPOFOL 10 MG/ML IV BOLUS
INTRAVENOUS | Status: AC
Start: 2021-11-10 — End: ?
  Filled 2021-11-10: qty 20

## 2021-11-10 MED ORDER — ONDANSETRON HCL 4 MG/2ML IJ SOLN
INTRAMUSCULAR | Status: DC | PRN
  Administered 2021-11-10: 4 mg via INTRAVENOUS

## 2021-11-10 MED ORDER — 0.9 % SODIUM CHLORIDE (POUR BTL) OPTIME
TOPICAL | Status: DC | PRN
  Administered 2021-11-10: 1000 mL

## 2021-11-10 MED ORDER — LIDOCAINE HCL (PF) 1 % IJ SOLN
INTRAMUSCULAR | Status: AC
  Filled 2021-11-10: qty 30

## 2021-11-10 MED ORDER — CHLORHEXIDINE GLUCONATE 0.12 % MT SOLN
15.0000 mL | Freq: Once | OROMUCOSAL | Status: AC
  Administered 2021-11-10: 15 mL via OROMUCOSAL

## 2021-11-10 MED ORDER — ONDANSETRON HCL 4 MG/2ML IJ SOLN: 4.0000 mg | Freq: Once | INTRAMUSCULAR | Status: DC | PRN

## 2021-11-10 MED ORDER — FENTANYL CITRATE (PF) 100 MCG/2ML IJ SOLN
INTRAMUSCULAR | Status: AC
  Filled 2021-11-10: qty 2

## 2021-11-10 MED ORDER — CHLORHEXIDINE GLUCONATE CLOTH 2 % EX PADS: 6.0000 | MEDICATED_PAD | Freq: Once | CUTANEOUS | Status: DC

## 2021-11-10 MED ORDER — FENTANYL CITRATE (PF) 100 MCG/2ML IJ SOLN
INTRAMUSCULAR | Status: DC | PRN
  Administered 2021-11-10 (×2): 25 ug via INTRAVENOUS

## 2021-11-10 MED ORDER — OXYCODONE HCL 5 MG PO TABS
5.0000 mg | ORAL_TABLET | Freq: Once | ORAL | Status: AC | PRN
  Administered 2021-11-10: 5 mg via ORAL
  Filled 2021-11-10: qty 1

## 2021-11-10 MED ORDER — BUPIVACAINE HCL (PF) 0.5 % IJ SOLN
INTRAMUSCULAR | Status: AC
  Filled 2021-11-10: qty 30

## 2021-11-10 MED ORDER — FENTANYL CITRATE PF 50 MCG/ML IJ SOSY
PREFILLED_SYRINGE | INTRAMUSCULAR | Status: AC
  Filled 2021-11-10: qty 1

## 2021-11-10 MED ORDER — CHLORHEXIDINE GLUCONATE 0.12 % MT SOLN
OROMUCOSAL | Status: AC
  Filled 2021-11-10: qty 15

## 2021-11-10 MED ORDER — BUPIVACAINE HCL 0.5 % IJ SOLN
INTRAMUSCULAR | Status: DC | PRN
  Administered 2021-11-10: 10 mL

## 2021-11-10 MED ORDER — CEFAZOLIN SODIUM-DEXTROSE 2-4 GM/100ML-% IV SOLN
2.0000 g | INTRAVENOUS | Status: AC
  Administered 2021-11-10: 2 mg via INTRAVENOUS

## 2021-11-10 MED ORDER — OXYCODONE HCL 5 MG/5ML PO SOLN: 5.0000 mg | Freq: Once | ORAL | Status: AC | PRN

## 2021-11-10 MED ORDER — LACTATED RINGERS IV SOLN: INTRAVENOUS | Status: DC

## 2021-11-10 MED ORDER — ORAL CARE MOUTH RINSE: 15.0000 mL | Freq: Once | OROMUCOSAL | Status: AC

## 2021-11-10 MED ORDER — CEFAZOLIN SODIUM-DEXTROSE 2-4 GM/100ML-% IV SOLN
INTRAVENOUS | Status: AC
  Filled 2021-11-10: qty 100

## 2021-11-10 SURGICAL SUPPLY — 31 items
ADH SKN CLS APL DERMABOND .7 (GAUZE/BANDAGES/DRESSINGS) ×1
ADH SKN CLS LQ APL DERMABOND (GAUZE/BANDAGES/DRESSINGS) ×1
APL PRP STRL LF DISP 70% ISPRP (MISCELLANEOUS) ×1
CHLORAPREP W/TINT 26 (MISCELLANEOUS) ×1 IMPLANT
CLOTH BEACON ORANGE TIMEOUT ST (SAFETY) ×1 IMPLANT
COVER LIGHT HANDLE STERIS (MISCELLANEOUS) ×2 IMPLANT
DERMABOND ADVANCED .7 DNX12 (GAUZE/BANDAGES/DRESSINGS) IMPLANT
DERMABOND ADVANCED .7 DNX6 (GAUZE/BANDAGES/DRESSINGS) IMPLANT
ELECT REM PT RETURN 9FT ADLT (ELECTROSURGICAL) ×1
ELECTRODE REM PT RTRN 9FT ADLT (ELECTROSURGICAL) ×1 IMPLANT
GLOVE BIO SURGEON STRL SZ 6.5 (GLOVE) IMPLANT
GLOVE BIOGEL PI IND STRL 7.0 (GLOVE) ×2 IMPLANT
GLOVE SURG SS PI 7.5 STRL IVOR (GLOVE) ×1 IMPLANT
GOWN STRL REUS W/TWL LRG LVL3 (GOWN DISPOSABLE) ×2 IMPLANT
KIT TURNOVER KIT A (KITS) ×1 IMPLANT
MANIFOLD NEPTUNE II (INSTRUMENTS) ×1 IMPLANT
NDL HYPO 18GX1.5 BLUNT FILL (NEEDLE) ×1 IMPLANT
NDL HYPO 25X1 1.5 SAFETY (NEEDLE) ×1 IMPLANT
NEEDLE HYPO 18GX1.5 BLUNT FILL (NEEDLE) IMPLANT
NEEDLE HYPO 25X1 1.5 SAFETY (NEEDLE) ×1 IMPLANT
NS IRRIG 1000ML POUR BTL (IV SOLUTION) ×1 IMPLANT
PACK MINOR (CUSTOM PROCEDURE TRAY) ×1 IMPLANT
PAD ARMBOARD 7.5X6 YLW CONV (MISCELLANEOUS) ×1 IMPLANT
SET BASIN LINEN APH (SET/KITS/TRAYS/PACK) ×1 IMPLANT
SPONGE GAUZE 2X2 8PLY STRL LF (GAUZE/BANDAGES/DRESSINGS) IMPLANT
STRIP CLOSURE SKIN 1/4X3 (GAUZE/BANDAGES/DRESSINGS) IMPLANT
SUT MNCRL AB 4-0 PS2 18 (SUTURE) ×1 IMPLANT
SUT SILK 2 0 SH (SUTURE) ×1 IMPLANT
SUT VIC AB 3-0 SH 27 (SUTURE) ×2
SUT VIC AB 3-0 SH 27X BRD (SUTURE) ×1 IMPLANT
SYR CONTROL 10ML LL (SYRINGE) ×1 IMPLANT

## 2021-11-10 NOTE — Transfer of Care (Signed)
Immediate Anesthesia Transfer of Care Note  Patient: Virginia Blake  Procedure(s) Performed: EXCISION OF BREAST BIOPSY, LEFT (Left: Breast)  Patient Location: PACU  Anesthesia Type:General  Level of Consciousness: awake, alert , oriented and patient cooperative  Airway & Oxygen Therapy: Patient connected to nasal cannula oxygen  Post-op Assessment: Report given to RN, Post -op Vital signs reviewed and stable and Patient moving all extremities X 4  Post vital signs: Reviewed and stable  Last Vitals:  Vitals Value Taken Time  BP 157/72 11/10/21 0909  Temp 36.7   Pulse 87 11/10/21 0909  Resp 18 11/10/21 0909  SpO2 100 % 11/10/21 0909  Vitals shown include unvalidated device data.  Last Pain:  Vitals:   11/10/21 0800  PainSc: 0-No pain         Complications: No notable events documented.

## 2021-11-10 NOTE — Anesthesia Procedure Notes (Signed)
Procedure Name: LMA Insertion Date/Time: 11/10/2021 8:26 AM  Performed by: Camillia Herter, RNPre-anesthesia Checklist: Patient identified, Emergency Drugs available, Suction available and Patient being monitored Patient Re-evaluated:Patient Re-evaluated prior to induction Oxygen Delivery Method: Circle system utilized Preoxygenation: Pre-oxygenation with 100% oxygen Induction Type: IV induction Ventilation: Mask ventilation without difficulty LMA: LMA inserted LMA Size: 3.0 Number of attempts: 2 Placement Confirmation: positive ETCO2 and breath sounds checked- equal and bilateral Tube secured with: Tape Dental Injury: Teeth and Oropharynx as per pre-operative assessment

## 2021-11-10 NOTE — Anesthesia Postprocedure Evaluation (Signed)
Anesthesia Post Note  Patient: Virginia Blake  Procedure(s) Performed: EXCISION OF BREAST BIOPSY, LEFT (Left: Breast)  Patient location during evaluation: Phase II Anesthesia Type: General Level of consciousness: awake Pain management: pain level controlled Vital Signs Assessment: post-procedure vital signs reviewed and stable Respiratory status: spontaneous breathing and respiratory function stable Cardiovascular status: blood pressure returned to baseline and stable Postop Assessment: no headache and no apparent nausea or vomiting Anesthetic complications: no Comments: Late entry   No notable events documented.   Last Vitals:  Vitals:   11/10/21 0930 11/10/21 0952  BP: (!) 160/72 (!) 150/70  Pulse: 85 82  Resp: (!) 22 (!) 24  Temp:  36.8 C  SpO2: 93% 96%    Last Pain:  Vitals:   11/10/21 0952  TempSrc: Oral  PainSc: [place]

## 2021-11-10 NOTE — Anesthesia Preprocedure Evaluation (Signed)
Anesthesia Evaluation  Patient identified by MRN, date of birth, ID band Patient awake    Reviewed: Allergy & Precautions, H&P , NPO status , Patient's Chart, lab work & pertinent test results, reviewed documented beta blocker date and time   History of Anesthesia Complications (+) PROLONGED EMERGENCE and history of anesthetic complications  Airway Mallampati: II  TM Distance: >3 FB Neck ROM: full    Dental no notable dental hx. (+) Teeth Intact   Pulmonary COPD,  COPD inhaler, Current Smoker,    Pulmonary exam normal breath sounds clear to auscultation       Cardiovascular Exercise Tolerance: Good negative cardio ROS   Rhythm:regular Rate:Normal     Neuro/Psych Anxiety negative neurological ROS  negative psych ROS   GI/Hepatic negative GI ROS, Neg liver ROS,   Endo/Other  negative endocrine ROS  Renal/GU negative Renal ROS  negative genitourinary   Musculoskeletal   Abdominal   Peds  Hematology negative hematology ROS (+)   Anesthesia Other Findings   Reproductive/Obstetrics negative OB ROS                             Anesthesia Physical  Anesthesia Plan  ASA: 2  Anesthesia Plan: General and General LMA   Post-op Pain Management:    Induction:   PONV Risk Score and Plan: Ondansetron  Airway Management Planned:   Additional Equipment:   Intra-op Plan:   Post-operative Plan:   Informed Consent: I have reviewed the patients History and Physical, chart, labs and discussed the procedure including the risks, benefits and alternatives for the proposed anesthesia with the patient or authorized representative who has indicated his/her understanding and acceptance.     Dental Advisory Given  Plan Discussed with: CRNA  Anesthesia Plan Comments:         Anesthesia Quick Evaluation                                  Anesthesia Evaluation  Patient identified by MRN, date  of birth, ID band Patient awake    Reviewed: Allergy & Precautions, H&P , NPO status , Patient's Chart, lab work & pertinent test results, reviewed documented beta blocker date and time   Airway Mallampati: II  TM Distance: >3 FB Neck ROM: full    Dental no notable dental hx. (+) Teeth Intact   Pulmonary COPD, Current Smoker,    Pulmonary exam normal breath sounds clear to auscultation       Cardiovascular Exercise Tolerance: Good negative cardio ROS   Rhythm:regular Rate:Normal     Neuro/Psych PSYCHIATRIC DISORDERS Anxiety  Neuromuscular disease    GI/Hepatic negative GI ROS, Neg liver ROS,   Endo/Other  negative endocrine ROS  Renal/GU negative Renal ROS  negative genitourinary   Musculoskeletal   Abdominal   Peds  Hematology negative hematology ROS (+)   Anesthesia Other Findings   Reproductive/Obstetrics negative OB ROS                             Anesthesia Physical  Anesthesia Plan  ASA: II  Anesthesia Plan: General   Post-op Pain Management:    Induction:   PONV Risk Score and Plan: Propofol infusion and TIVA  Airway Management Planned:   Additional Equipment:   Intra-op Plan:   Post-operative Plan:   Informed Consent: I have reviewed  the patients History and Physical, chart, labs and discussed the procedure including the risks, benefits and alternatives for the proposed anesthesia with the patient or authorized representative who has indicated his/her understanding and acceptance.     Dental Advisory Given  Plan Discussed with: CRNA  Anesthesia Plan Comments:         Anesthesia Quick Evaluation                                   Anesthesia Evaluation  Patient identified by MRN, date of birth, ID band Patient awake    Reviewed: Allergy & Precautions, H&P , NPO status , Patient's Chart, lab work & pertinent test results, reviewed documented beta blocker date and time    Airway Mallampati: II  TM Distance: >3 FB Neck ROM: full    Dental no notable dental hx. (+) Teeth Intact   Pulmonary COPD, Current Smoker,    Pulmonary exam normal breath sounds clear to auscultation       Cardiovascular Exercise Tolerance: Good negative cardio ROS   Rhythm:regular Rate:Normal     Neuro/Psych PSYCHIATRIC DISORDERS Anxiety  Neuromuscular disease    GI/Hepatic negative GI ROS, Neg liver ROS,   Endo/Other  negative endocrine ROS  Renal/GU negative Renal ROS  negative genitourinary   Musculoskeletal   Abdominal   Peds  Hematology negative hematology ROS (+)   Anesthesia Other Findings   Reproductive/Obstetrics negative OB ROS                             Anesthesia Physical  Anesthesia Plan  ASA: II  Anesthesia Plan: General   Post-op Pain Management:    Induction:   PONV Risk Score and Plan: Propofol infusion and TIVA  Airway Management Planned:   Additional Equipment:   Intra-op Plan:   Post-operative Plan:   Informed Consent: I have reviewed the patients History and Physical, chart, labs and discussed the procedure including the risks, benefits and alternatives for the proposed anesthesia with the patient or authorized representative who has indicated his/her understanding and acceptance.     Dental Advisory Given  Plan Discussed with: CRNA  Anesthesia Plan Comments:         Anesthesia Quick Evaluation

## 2021-11-10 NOTE — Interval H&P Note (Signed)
History and Physical Interval Note:  11/10/2021 8:00 AM  Virginia Blake  has presented today for surgery, with the diagnosis of RECURRENT LEFT BREAST CANCER.  The various methods of treatment have been discussed with the patient and family. After consideration of risks, benefits and other options for treatment, the patient has consented to  Procedure(s): EXCISION OF BREAST BIOPSY, LEFT (Left) as a surgical intervention.  The patient's history has been reviewed, patient examined, no change in status, stable for surgery.  I have reviewed the patient's chart and labs.  Questions were answered to the patient's satisfaction.     Aviva Signs

## 2021-11-10 NOTE — Op Note (Signed)
Patient:  Virginia Blake  DOB:  Jul 14, 1950  MRN:  [id number]   Preop Diagnosis: Recurrent left breast cancer  Postop Diagnosis: Same  Procedure: Left breast biopsy  Surgeon: Aviva Signs, MD  Anes: General  Indications: Patient is a 71 year old white female with a history of left breast cancer who has had multiple recurrences along the chest wall of the previous modified radical mastectomy site.  She has developed several fixed hard subcutaneous nodules along the chest wall.  She now presents for excision.  The risks and benefits of the procedure were fully explained to the patient, who gave informed consent.  Procedure note: The patient was placed in supine position.  After general anesthesia was administered, the left chest wall was prepped and draped using the usual sterile technique with ChloraPrep.  Surgical site confirmation was performed.  The patient did have some fixation of the surgical scar to the chest wall.  Several subcutaneous hard nodules were noted to be fixed against the chest wall.  An incision was made to connect the 2 areas of concern.  Superior and inferior skin flaps were created.  I end up excising 3 small 5-7 millimeter hard subcutaneous nodules.  2 of them were fixated to the rib and chest wall.  Minimal pectoralis major muscle was present.  All 3 nodules were sent to pathology further examination.  A bleeding was controlled using Bovie electrocautery.  The subcutaneous layer was reapproximated using 3-0 Vicryl interrupted suture.  0.5% Sensorcaine was instilled into the surrounding wound.  The skin was closed using a 4-0 Monocryl subcuticular suture.  Dermabond was applied.  All tape and needle counts were correct at the end of the procedure.  The patient was awakened and transferred to PACU in stable condition.  Complications: None  EBL: Minimal  Specimen: Subcutaneous nodules, left chest wall

## 2021-11-13 ENCOUNTER — Encounter (HOSPITAL_COMMUNITY): Payer: Self-pay | Admitting: General Surgery

## 2021-11-13 LAB — SURGICAL PATHOLOGY

## 2021-11-24 DIAGNOSIS — H35372 Puckering of macula, left eye: Secondary | ICD-10-CM | POA: Diagnosis not present

## 2021-11-25 ENCOUNTER — Inpatient Hospital Stay: Payer: Medicare PPO | Attending: Hematology

## 2021-11-25 DIAGNOSIS — C50912 Malignant neoplasm of unspecified site of left female breast: Secondary | ICD-10-CM | POA: Insufficient documentation

## 2021-11-25 DIAGNOSIS — M818 Other osteoporosis without current pathological fracture: Secondary | ICD-10-CM | POA: Insufficient documentation

## 2021-11-25 DIAGNOSIS — Z170 Estrogen receptor positive status [ER+]: Secondary | ICD-10-CM | POA: Insufficient documentation

## 2021-11-26 ENCOUNTER — Encounter: Payer: Self-pay | Admitting: General Surgery

## 2021-11-26 ENCOUNTER — Other Ambulatory Visit: Payer: Self-pay

## 2021-11-26 ENCOUNTER — Ambulatory Visit (INDEPENDENT_AMBULATORY_CARE_PROVIDER_SITE_OTHER): Payer: Medicare PPO | Admitting: General Surgery

## 2021-11-26 VITALS — BP 147/72 | HR 82 | Temp 98.2°F | Resp 20 | Ht 59.0 in | Wt 120.0 lb

## 2021-11-26 DIAGNOSIS — Z09 Encounter for follow-up examination after completed treatment for conditions other than malignant neoplasm: Secondary | ICD-10-CM

## 2021-11-26 NOTE — Progress Notes (Signed)
Subjective:     Virginia Blake  Here for wound check.  Doing well, no complaints. Objective:    BP (!) 147/72   Pulse 82   Temp 98.2 F (36.8 C) (Oral)   Resp 20   Ht '4\' 11"'$  (1.499 m)   Wt 120 lb (54.4 kg)   SpO2 96%   BMI 24.24 kg/m   General:  alert, cooperative, and no distress  Left chest/breast wound well healed.     Assessment:    Doing well postoperatively.    Plan:    Encouraged to keep her appt with Dr. Delton Coombes of Oncology tomorrow.  Follow up here as needed.

## 2021-11-27 ENCOUNTER — Inpatient Hospital Stay: Payer: Medicare PPO | Admitting: Hematology

## 2021-12-05 ENCOUNTER — Inpatient Hospital Stay: Payer: Medicare PPO

## 2021-12-05 DIAGNOSIS — Z170 Estrogen receptor positive status [ER+]: Secondary | ICD-10-CM | POA: Diagnosis not present

## 2021-12-05 DIAGNOSIS — C50912 Malignant neoplasm of unspecified site of left female breast: Secondary | ICD-10-CM | POA: Diagnosis not present

## 2021-12-05 DIAGNOSIS — M818 Other osteoporosis without current pathological fracture: Secondary | ICD-10-CM | POA: Diagnosis not present

## 2021-12-06 LAB — CANCER ANTIGEN 27.29: CA 27.29: 11.6 U/mL (ref 0.0–38.6)

## 2021-12-06 LAB — CANCER ANTIGEN 15-3: CA 15-3: 12.5 U/mL (ref 0.0–25.0)

## 2021-12-18 ENCOUNTER — Inpatient Hospital Stay: Payer: Medicare PPO | Admitting: Hematology

## 2021-12-18 DIAGNOSIS — H35372 Puckering of macula, left eye: Secondary | ICD-10-CM | POA: Diagnosis not present

## 2021-12-26 DIAGNOSIS — J440 Chronic obstructive pulmonary disease with acute lower respiratory infection: Secondary | ICD-10-CM | POA: Diagnosis not present

## 2021-12-26 DIAGNOSIS — R350 Frequency of micturition: Secondary | ICD-10-CM | POA: Diagnosis not present

## 2021-12-26 DIAGNOSIS — F17210 Nicotine dependence, cigarettes, uncomplicated: Secondary | ICD-10-CM | POA: Diagnosis not present

## 2021-12-26 DIAGNOSIS — N390 Urinary tract infection, site not specified: Secondary | ICD-10-CM | POA: Diagnosis not present

## 2021-12-26 DIAGNOSIS — J209 Acute bronchitis, unspecified: Secondary | ICD-10-CM | POA: Diagnosis not present

## 2021-12-26 DIAGNOSIS — Z299 Encounter for prophylactic measures, unspecified: Secondary | ICD-10-CM | POA: Diagnosis not present

## 2022-01-06 ENCOUNTER — Inpatient Hospital Stay: Payer: Medicare PPO | Admitting: Hematology

## 2022-01-28 DIAGNOSIS — G894 Chronic pain syndrome: Secondary | ICD-10-CM | POA: Diagnosis not present

## 2022-01-28 DIAGNOSIS — M5416 Radiculopathy, lumbar region: Secondary | ICD-10-CM | POA: Diagnosis not present

## 2022-01-28 DIAGNOSIS — M461 Sacroiliitis, not elsewhere classified: Secondary | ICD-10-CM | POA: Diagnosis not present

## 2022-02-02 ENCOUNTER — Inpatient Hospital Stay: Payer: Medicare PPO | Attending: Hematology | Admitting: Hematology

## 2022-02-02 VITALS — BP 143/71 | HR 83 | Temp 97.8°F | Resp 18 | Ht 59.0 in | Wt 121.8 lb

## 2022-02-02 DIAGNOSIS — F17210 Nicotine dependence, cigarettes, uncomplicated: Secondary | ICD-10-CM | POA: Insufficient documentation

## 2022-02-02 DIAGNOSIS — M810 Age-related osteoporosis without current pathological fracture: Secondary | ICD-10-CM | POA: Insufficient documentation

## 2022-02-02 DIAGNOSIS — Z170 Estrogen receptor positive status [ER+]: Secondary | ICD-10-CM | POA: Insufficient documentation

## 2022-02-02 DIAGNOSIS — Z9012 Acquired absence of left breast and nipple: Secondary | ICD-10-CM | POA: Insufficient documentation

## 2022-02-02 DIAGNOSIS — C50912 Malignant neoplasm of unspecified site of left female breast: Secondary | ICD-10-CM | POA: Diagnosis not present

## 2022-02-02 DIAGNOSIS — C50412 Malignant neoplasm of upper-outer quadrant of left female breast: Secondary | ICD-10-CM | POA: Diagnosis not present

## 2022-02-02 MED ORDER — ANASTROZOLE 1 MG PO TABS: 1.0000 mg | ORAL_TABLET | Freq: Every day | ORAL | 6 refills | Status: AC

## 2022-02-02 NOTE — Patient Instructions (Addendum)
[facility]  Discharge Instructions  You were seen and examined today by Dr. Delton Coombes.  Dr. Delton Coombes discussed your most recent surgery.  He recommends getting a PET scan to make sure that there is no cancer remaining. If it is he will refer you for radiation.  Dr. Delton Coombes is sending in Anastrozole an antiestrogen for you to take once daily to help decrease your risk of the breast cancer recurring.   Follow-up as scheduled after PET scan.    Thank you for choosing [facility] to provide your oncology and hematology care.   To afford each patient quality time with our provider, please arrive at least 15 minutes before your scheduled appointment time. You may need to reschedule your appointment if you arrive late (10 or more minutes). Arriving late affects you and other patients whose appointments are after yours.  Also, if you miss three or more appointments without notifying the office, you may be dismissed from the clinic at the provider's discretion.    Again, thank you for choosing [facility].  Our hope is that these requests will decrease the amount of time that you wait before being seen by our physicians.   If you have a lab appointment with the [facility] please come in thru the Main Entrance and check in at the main information desk.           _____________________________________________________________  Should you have questions after your visit to [facility], please contact our office at [phone] and follow the prompts.  Our office hours are 8:00 a.m. to 4:30 p.m. Monday - Thursday and 8:00 a.m. to 2:30 p.m. Friday.  Please note that voicemails left after 4:00 p.m. may not be returned until the following business day.  We are closed weekends and all major holidays.  You do have access to a nurse 24-7, just call the main number to the clinic [phone] and do not press any options,  hold on the line and a nurse will answer the phone.    For prescription refill requests, have your pharmacy contact our office and allow 72 hours.    Masks are optional in the cancer centers. If you would like for your care team to wear a mask while they are taking care of you, please let them know. You may have one support person who is at least 71 years old accompany you for your appointments.

## 2022-02-02 NOTE — Progress Notes (Signed)
[facility] [address] [place] [address]   Patient Care Team: Neale Burly, MD as PCP - General (Internal Medicine) Brien Mates, RN as Oncology Nurse Navigator (Oncology) Derek Jack, MD as Medical Oncologist (Medical Oncology)  SUMMARY OF ONCOLOGIC HISTORY: Oncology History   No history exists.    CHIEF COMPLIANT: Follow-up for left breast cancer   INTERVAL HISTORY: Virginia Blake is a 72 y.o. female seen for follow-up of left breast cancer.  She had skin nodules resected on 11/10/2021 by Dr. Arnoldo Morale.  Since the surgery she has been doing very well.  She also had eye surgery done.  She denies any new onset pains.  REVIEW OF SYSTEMS:   Review of Systems  Neurological:  Positive for dizziness.  All other systems reviewed and are negative.   I have reviewed the past medical history, past surgical history, social history and family history with the patient and they are unchanged from previous note.   ALLERGIES:   is allergic to ciprofloxacin, aspirin, doxycycline, and nsaids.   MEDICATIONS:  Current Outpatient Medications  Medication Sig Dispense Refill   albuterol (VENTOLIN HFA) 108 (90 Base) MCG/ACT inhaler Inhale into the lungs every 4 (four) hours as needed for wheezing or shortness of breath.     ALPRAZolam (XANAX) 1 MG tablet Take 1 mg by mouth 4 (four) times daily as needed for anxiety.  5   anastrozole (ARIMIDEX) 1 MG tablet Take 1 tablet (1 mg total) by mouth daily. 30 tablet 6   cholecalciferol (VITAMIN D3) 25 MCG (1000 UNIT) tablet Take 1,000 Units by mouth in the morning and at bedtime.     HYDROcodone-acetaminophen (NORCO/VICODIN) 5-325 MG tablet Take 2 tablets by mouth every 4 (four) hours.     ipratropium-albuterol (DUONEB) 0.5-2.5 (3) MG/3ML SOLN Inhale 3 mLs into the lungs every 6 (six) hours as needed (asthma).  11   loperamide (IMODIUM A-D) 2 MG tablet Take 2 mg by mouth 4 (four) times daily as needed for diarrhea  or loose stools.     MAGNESIUM BISGLYCINATE PO Take 1 tablet by mouth in the morning and at bedtime. MagBlue by [employer] (Elite Magnesium 350 mg/PurityBlue Blueberries 100 mg/Boron 3 mg/Vitamin D 50 mcg/Zinc 15 mg)     NADH POWD Take 5 mg by mouth in the morning.     OVER THE COUNTER MEDICATION Take 1 Scoop by mouth 2 (two) times a week. Super Beets     prednisoLONE acetate (PRED FORTE) 1 % ophthalmic suspension Place 1 drop into the left eye 4 (four) times daily.     sulfamethoxazole-trimethoprim (BACTRIM) 400-80 MG tablet Take 1 tablet by mouth 2 (two) times daily.     SYMBICORT 80-4.5 MCG/ACT inhaler Inhale 2 puffs into the lungs in the morning and at bedtime.     No current facility-administered medications for this visit.     PHYSICAL EXAMINATION: Performance status (ECOG): 1 - Symptomatic but completely ambulatory  Vitals:   02/02/22 1600  BP: (!) 143/71  Pulse: 83  Resp: 18  Temp: 97.8 F (36.6 C)  SpO2: 97%    Wt Readings from Last 3 Encounters:  02/02/22 121 lb 12.8 oz (55.2 kg)  11/26/21 120 lb (54.4 kg)  11/05/21 118 lb (53.5 kg)   Physical Exam Vitals reviewed.  Constitutional:      Appearance: Normal appearance.  Cardiovascular:     Rate and Rhythm: Normal rate and regular rhythm.     Pulses:  Normal pulses.     Heart sounds: Normal heart sounds.  Pulmonary:     Effort: Pulmonary effort is normal.     Breath sounds: Normal breath sounds.  Chest:  Breasts:    Left: Absent. No mass or skin change (mastectomy site WNL).  Neurological:     General: No focal deficit present.     Mental Status: She is alert and oriented to person, place, and time.  Psychiatric:        Mood and Affect: Mood normal.        Behavior: Behavior normal.        LABORATORY DATA:  I have reviewed the data as listed    Latest Ref Rng & Units 09/02/2021   11:16 AM 04/24/2021    1:29 PM 02/19/2021   10:54 AM  CMP  Glucose 70 - 99 mg/dL 107  105  113   BUN 8 - 23 mg/dL '9   11  10   '$ Creatinine 0.44 - 1.00 mg/dL 0.54  0.66  0.63   Sodium 135 - 145 mmol/L 140  139  136   Potassium 3.5 - 5.1 mmol/L 4.8  5.1  4.2   Chloride 98 - 111 mmol/L 100  96  95   CO2 22 - 32 mmol/L 33  36  36   Calcium 8.9 - 10.3 mg/dL 9.3  9.8  9.2   Total Protein 6.5 - 8.1 g/dL 7.1  7.3  7.5   Total Bilirubin 0.3 - 1.2 mg/dL 0.4  0.5  0.4   Alkaline Phos 38 - 126 U/L 52  53  58   AST 15 - 41 U/L '21  25  24   '$ ALT 0 - 44 U/L '13  16  12    '$ Lab Results  Component Value Date   CAN153 12.5 12/05/2021   CAN153 15.1 11/30/2019   Lab Results  Component Value Date   WBC 5.2 09/02/2021   HGB 13.1 09/02/2021   HCT 42.0 09/02/2021   MCV 99.5 09/02/2021   PLT 231 09/02/2021   NEUTROABS 2.2 09/02/2021    ASSESSMENT:  1.  Recurrent left breast cancer: -Patient recently noticed a nodule in the left breast for the past several months. -As it was growing, she had mammogram right breast and ultrasound guided biopsy of the left breast on 11/06/2019 at [facility], [place]. -Pathology on 11/01/2019 consistent with grade 2 invasive ductal carcinoma, ER 95%, PR 0%, Ki-67 10%, HER-2 2+ by IHC, negative by FISH. -PET scan on 01/08/2020 with tiny soft tissue nodule in the medial aspect of the left mastectomy bed with low-level FDG accumulation compatible with biopsy-proven malignancy with no evidence of metastatic disease.  Tiny lung nodules nonspecific.   2.  Left breast cancer (stage Ia): -Status post left mastectomy and sentinel lymph node biopsy on 02/05/2015 by Dr. Anthony Sar in [place]. -Pathology shows 6 mm, grade 1, positive lymphovascular invasion, single focus, margins negative, ER more than 90%, PR 10%, Ki-67 26%, HER-2 2+, negative by FISH. -She did not take antiestrogen therapy as she was afraid of the side effects.  -Excision of the left breast nodule on 02/16/2020 by Dr. Arnoldo Morale. 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  Infiltrating  ductal carcinoma involving superior and inferior margins and medial margin.  Tumor measures 1 x 0.8 x 0.8 cm.  ER was 95% positive, PR negative, HER-2 2+ and negative by FISH.  Ki-67 10%. - Reexcision of the left breast  tissue shows grade 2 IDC on 05/10/2020.  Invasive carcinoma present at the inferior medial margin focally and less than 1 mm from the deep margin focally. - Left breast tissue excision on 07/19/2020 with no residual malignancy. - She took anastrozole for 10 days and stopped taking due to aching pains in the arms and bones. - She tried tamoxifen for few weeks in April 2023.  She has experienced body pains and stopped taking them in June 2023.   3.  Social/family history: -Lives at home with ex-husband.  She used to do office work. -Current active smoker 1-2 packs/day for 62 years. -Paternal uncle had cancer, type unknown to the patient.   4.  Osteoporosis: -Last bone density on 10/28/2016 shows T score -2.9. -She was not treated for osteoporosis. -DEXA scan on 11/20/2019 with T score -3.2.   PLAN:  1.  Recurrent left breast cancer, ER positive, PR and HER-2 negative: - I reviewed operative report from Dr. Arnoldo Morale.  Several subcutaneous hard nodules were noted to be fixed against the chest wall.  3 small 5-7 mm hard subcutaneous nodules were excised.  2 of them were fixated to the rib and chest wall.  Minimal pectoralis major muscle was present. - I reviewed pathology report which showed invasive ductal carcinoma, grade 2, ER 95% positive, PR negative, Ki-67 10%.  HER2 2+, FISH negative. - Left mastectomy site is well-healed.  Labs from 12/05/2021 shows normal tumor markers. - Recommend PET CT scan for restaging. - Right breast mammogram on 04/24/2021 was BI-RADS Category 1. - We talked about antiestrogen therapy.  She could not tolerate tamoxifen as it caused her fibromyalgia to get worse. - We talked about initiating her on anastrozole.  We discussed side effects in detail.  She  told me that she will try taking it at this time.  Initially she did not take it. - RTC after PET scan.  Will consider referral to radiation therapy if there is no other metastatic disease.   2.  Osteoporosis: - Continue calcium and vitamin D supplements.  We have previously discussed Prolia after dental evaluation.  Last vitamin D was 66.  3.  Smoking history: - CT lung cancer scan from 04/07/2021 was lung RADS 2S.  She has a 40-pack-year smoking history.  Breast Cancer therapy associated bone loss: I have recommended calcium, Vitamin D and weight bearing exercises.  Orders placed this encounter:  Orders Placed This Encounter  Procedures   NM PET Image Restag (PS) Skull Base To Thigh     The patient has a good understanding of the overall plan. She agrees with it. She will call with any problems that may develop before the next visit here.  Derek Jack, MD [facility] [phone]

## 2022-02-12 ENCOUNTER — Ambulatory Visit (HOSPITAL_COMMUNITY)
Admission: RE | Admit: 2022-02-12 | Discharge: 2022-02-12 | Disposition: A | Discharge status: Home / Self Care | Payer: Medicare PPO | Source: Ambulatory Visit | Attending: Hematology | Admitting: Hematology

## 2022-02-12 DIAGNOSIS — Z170 Estrogen receptor positive status [ER+]: Secondary | ICD-10-CM

## 2022-02-12 DIAGNOSIS — C50412 Malignant neoplasm of upper-outer quadrant of left female breast: Secondary | ICD-10-CM | POA: Insufficient documentation

## 2022-02-13 DIAGNOSIS — Z299 Encounter for prophylactic measures, unspecified: Secondary | ICD-10-CM | POA: Diagnosis not present

## 2022-02-13 DIAGNOSIS — J440 Chronic obstructive pulmonary disease with acute lower respiratory infection: Secondary | ICD-10-CM | POA: Diagnosis not present

## 2022-02-13 DIAGNOSIS — F17210 Nicotine dependence, cigarettes, uncomplicated: Secondary | ICD-10-CM | POA: Diagnosis not present

## 2022-02-13 DIAGNOSIS — R350 Frequency of micturition: Secondary | ICD-10-CM | POA: Diagnosis not present

## 2022-02-13 DIAGNOSIS — D692 Other nonthrombocytopenic purpura: Secondary | ICD-10-CM | POA: Diagnosis not present

## 2022-02-13 DIAGNOSIS — N390 Urinary tract infection, site not specified: Secondary | ICD-10-CM | POA: Diagnosis not present

## 2022-02-19 ENCOUNTER — Encounter (HOSPITAL_COMMUNITY)
Admission: RE | Admit: 2022-02-19 | Discharge: 2022-02-19 | Disposition: A | Discharge status: Home / Self Care | Payer: Medicare PPO | Source: Ambulatory Visit | Attending: Hematology | Admitting: Hematology

## 2022-02-19 ENCOUNTER — Ambulatory Visit: Payer: Medicare PPO | Admitting: Hematology

## 2022-02-19 DIAGNOSIS — C50412 Malignant neoplasm of upper-outer quadrant of left female breast: Secondary | ICD-10-CM | POA: Insufficient documentation

## 2022-02-19 DIAGNOSIS — Z170 Estrogen receptor positive status [ER+]: Secondary | ICD-10-CM | POA: Diagnosis not present

## 2022-02-19 MED ORDER — FLUDEOXYGLUCOSE F - 18 (FDG) INJECTION
6.1900 | Freq: Once | INTRAVENOUS | Status: AC | PRN
  Administered 2022-02-19: 6.19 via INTRAVENOUS

## 2022-02-25 ENCOUNTER — Inpatient Hospital Stay: Payer: Medicare PPO | Attending: Hematology | Admitting: Hematology

## 2022-02-25 DIAGNOSIS — Z122 Encounter for screening for malignant neoplasm of respiratory organs: Secondary | ICD-10-CM

## 2022-02-25 DIAGNOSIS — C50412 Malignant neoplasm of upper-outer quadrant of left female breast: Secondary | ICD-10-CM | POA: Diagnosis not present

## 2022-02-25 DIAGNOSIS — C50912 Malignant neoplasm of unspecified site of left female breast: Secondary | ICD-10-CM

## 2022-02-25 DIAGNOSIS — Z170 Estrogen receptor positive status [ER+]: Secondary | ICD-10-CM

## 2022-02-25 NOTE — Progress Notes (Signed)
Virtual Visit via Telephone Note  I connected with Virginia Blake on 02/25/22  at 11:00 AM EST by telephone and verified that I am speaking with the correct person using two identifiers.  Location: Patient: home Provider: office   I discussed the limitations, risks, security and privacy concerns of performing an evaluation and management service by telephone and the availability of in person appointments. I also discussed with the patient that there may be a patient responsible charge related to this service. The patient expressed understanding and agreed to proceed.   History of Present Illness: [name] is a 72 y.o. female seen for follow-up of left breast cancer. She was last seen by me on 02/02/22. Today, she states that she is doing well overall. However, she is currently sick with gastroenteritis. She reports N/V/D and is trying to stay well hydrated. She has been around her grandchildren with similar symptoms.  Her appetite level is at 100%. Her energy level is at 60%. She has been compliant with the Anastrozole without any problems. She denies any hot flashes. She continues to have 7/10 chest wall pain.  Observations/Objective: She started taking anastrozole and is tolerating very well.  Denied any hot flashes.  Minor pain at the site of her chest wall resection.  Reportedly has gastroenteritis with vomiting and diarrhea this morning and hence could not come to the office.  Assessment and Plan: ASSESSMENT:  1.  Recurrent left breast cancer: -Patient recently noticed a nodule in the left breast for the past several months. -As it was growing, she had mammogram right breast and ultrasound guided biopsy of the left breast on 11/06/2019 at [facility], [place]. -Pathology on 11/01/2019 consistent with grade 2 invasive ductal carcinoma, ER 95%, PR 0%, Ki-67 10%, HER-2 2+ by IHC, negative by FISH. -PET scan on 01/08/2020 with tiny soft tissue nodule in the medial aspect of the left  mastectomy bed with low-level FDG accumulation compatible with biopsy-proven malignancy with no evidence of metastatic disease.  Tiny lung nodules nonspecific.   2.  Left breast cancer (stage Ia): -Status post left mastectomy and sentinel lymph node biopsy on 02/05/2015 by Dr. Anthony Sar in [place]. -Pathology shows 6 mm, grade 1, positive lymphovascular invasion, single focus, margins negative, ER more than 90%, PR 10%, Ki-67 26%, HER-2 2+, negative by FISH. -She did not take antiestrogen therapy as she was afraid of the side effects.  -Excision of the left breast nodule on 02/16/2020 by Dr. Arnoldo Morale. 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  Infiltrating ductal carcinoma involving superior and inferior margins and medial margin.  Tumor measures 1 x 0.8 x 0.8 cm.  ER was 95% positive, PR negative, HER-2 2+ and negative by FISH.  Ki-67 10%. - Reexcision of the left breast tissue shows grade 2 IDC on 05/10/2020.  Invasive carcinoma present at the inferior medial margin focally and less than 1 mm from the deep margin focally. - Left breast tissue excision on 07/19/2020 with no residual malignancy. - She took anastrozole for 10 days and stopped taking due to aching pains in the arms and bones. - She tried tamoxifen for few weeks in April 2023.  She has experienced body pains and stopped taking them in June 2023. - I reviewed operative report from Dr. Arnoldo Morale.  Several subcutaneous hard nodules were noted to be fixed against the chest wall.  3 small 5-7 mm hard subcutaneous nodules were excised.  2 of them were fixated  to the rib and chest wall.  Minimal pectoralis major muscle was present. - I reviewed pathology report which showed invasive ductal carcinoma, grade 2, ER 95% positive, PR negative, Ki-67 10%.  HER2 2+, FISH negative. - Left mastectomy site is well-healed.  Labs from 12/05/2021 shows normal tumor markers. - Anastrozole started on  02/02/2022.   3.  Social/family history: -Lives at home with ex-husband.  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 -Paternal uncle had cancer, type unknown to the patient.   4.  Osteoporosis: -Last bone density on 10/28/2016 shows T score -2.9. -She was not treated for osteoporosis. -DEXA scan on 11/20/2019 with T score -3.2.     PLAN:  1.  Recurrent left breast cancer, ER positive, PR and HER-2 negative: - I reviewed PET scan from 02/19/2022 which did not show any FDG avid sites of metastatic disease.  Mild nonspecific increased uptake associated with soft tissue thickening within the left chest wall. - I have strongly recommended that she have radiation therapy due to multiple recurrences in the chest wall.  Will make referral to radiation oncology. - We will arrange for right breast mammogram in April. - RTC 3 months for follow-up.   2.  Osteoporosis: - Continue calcium and vitamin D supplements.  We previously discussed Prolia after dental evaluation.  3.  Smoking history: - CT lung cancer screening scan from 04/07/2021 was lung RADS 2S. - She will have another scan this year.   Breast Cancer therapy associated bone loss: I have recommended calcium, Vitamin D and weight bearing exercises.  Follow Up Instructions: RTC 3 months   I discussed the assessment and treatment plan with the patient. The patient was provided an opportunity to ask questions and all were answered. The patient agreed with the plan and demonstrated an understanding of the instructions.   The patient was advised to call back or seek an in-person evaluation if the symptoms worsen or if the condition fails to improve as anticipated.  I provided 21 minutes of non-face-to-face time during this encounter.   I,Virginia Blake,acting as a Education administrator for [employer], MD.,have documented all relevant documentation on the behalf of Virginia Jack, MD,as directed by  Virginia Jack, MD while in the presence of Virginia Jack, MD.  I, Virginia Jack MD, have reviewed the above documentation for accuracy and completeness, and I agree with the above.   Virginia Jack, MD

## 2022-02-25 NOTE — Progress Notes (Signed)
Patient is taking Anastrozole as prescribed.  She has not missed any doses and reports no side effects at this time.

## 2022-03-09 DIAGNOSIS — F331 Major depressive disorder, recurrent, moderate: Secondary | ICD-10-CM | POA: Diagnosis not present

## 2022-03-10 DIAGNOSIS — Z780 Asymptomatic menopausal state: Secondary | ICD-10-CM | POA: Diagnosis not present

## 2022-03-10 DIAGNOSIS — C50812 Malignant neoplasm of overlapping sites of left female breast: Secondary | ICD-10-CM | POA: Diagnosis not present

## 2022-03-10 DIAGNOSIS — Z9012 Acquired absence of left breast and nipple: Secondary | ICD-10-CM | POA: Diagnosis not present

## 2022-03-10 DIAGNOSIS — Z510 Encounter for antineoplastic radiation therapy: Secondary | ICD-10-CM | POA: Diagnosis not present

## 2022-03-10 DIAGNOSIS — C50912 Malignant neoplasm of unspecified site of left female breast: Secondary | ICD-10-CM | POA: Diagnosis not present

## 2022-03-10 DIAGNOSIS — F17210 Nicotine dependence, cigarettes, uncomplicated: Secondary | ICD-10-CM | POA: Diagnosis not present

## 2022-03-10 DIAGNOSIS — Z79811 Long term (current) use of aromatase inhibitors: Secondary | ICD-10-CM | POA: Diagnosis not present

## 2022-03-10 DIAGNOSIS — C7989 Secondary malignant neoplasm of other specified sites: Secondary | ICD-10-CM | POA: Diagnosis not present

## 2022-03-10 DIAGNOSIS — Z170 Estrogen receptor positive status [ER+]: Secondary | ICD-10-CM | POA: Diagnosis not present

## 2022-03-10 DIAGNOSIS — J449 Chronic obstructive pulmonary disease, unspecified: Secondary | ICD-10-CM | POA: Diagnosis not present

## 2022-03-12 DIAGNOSIS — C50912 Malignant neoplasm of unspecified site of left female breast: Secondary | ICD-10-CM | POA: Diagnosis not present

## 2022-03-12 DIAGNOSIS — F17210 Nicotine dependence, cigarettes, uncomplicated: Secondary | ICD-10-CM | POA: Diagnosis not present

## 2022-03-12 DIAGNOSIS — Z780 Asymptomatic menopausal state: Secondary | ICD-10-CM | POA: Diagnosis not present

## 2022-03-12 DIAGNOSIS — Z170 Estrogen receptor positive status [ER+]: Secondary | ICD-10-CM | POA: Diagnosis not present

## 2022-03-12 DIAGNOSIS — J449 Chronic obstructive pulmonary disease, unspecified: Secondary | ICD-10-CM | POA: Diagnosis not present

## 2022-03-12 DIAGNOSIS — C7989 Secondary malignant neoplasm of other specified sites: Secondary | ICD-10-CM | POA: Diagnosis not present

## 2022-03-12 DIAGNOSIS — Z510 Encounter for antineoplastic radiation therapy: Secondary | ICD-10-CM | POA: Diagnosis not present

## 2022-03-12 DIAGNOSIS — Z79811 Long term (current) use of aromatase inhibitors: Secondary | ICD-10-CM | POA: Diagnosis not present

## 2022-03-12 DIAGNOSIS — Z9012 Acquired absence of left breast and nipple: Secondary | ICD-10-CM | POA: Diagnosis not present

## 2022-03-12 DIAGNOSIS — C50812 Malignant neoplasm of overlapping sites of left female breast: Secondary | ICD-10-CM | POA: Diagnosis not present

## 2022-03-20 DIAGNOSIS — C7989 Secondary malignant neoplasm of other specified sites: Secondary | ICD-10-CM | POA: Diagnosis not present

## 2022-03-20 DIAGNOSIS — Z9012 Acquired absence of left breast and nipple: Secondary | ICD-10-CM | POA: Diagnosis not present

## 2022-03-20 DIAGNOSIS — F17210 Nicotine dependence, cigarettes, uncomplicated: Secondary | ICD-10-CM | POA: Diagnosis not present

## 2022-03-20 DIAGNOSIS — Z79811 Long term (current) use of aromatase inhibitors: Secondary | ICD-10-CM | POA: Diagnosis not present

## 2022-03-20 DIAGNOSIS — C50912 Malignant neoplasm of unspecified site of left female breast: Secondary | ICD-10-CM | POA: Diagnosis not present

## 2022-03-20 DIAGNOSIS — Z170 Estrogen receptor positive status [ER+]: Secondary | ICD-10-CM | POA: Diagnosis not present

## 2022-03-20 DIAGNOSIS — J449 Chronic obstructive pulmonary disease, unspecified: Secondary | ICD-10-CM | POA: Diagnosis not present

## 2022-03-20 DIAGNOSIS — Z780 Asymptomatic menopausal state: Secondary | ICD-10-CM | POA: Diagnosis not present

## 2022-03-20 DIAGNOSIS — Z510 Encounter for antineoplastic radiation therapy: Secondary | ICD-10-CM | POA: Diagnosis not present

## 2022-03-20 DIAGNOSIS — C50812 Malignant neoplasm of overlapping sites of left female breast: Secondary | ICD-10-CM | POA: Diagnosis not present

## 2022-03-25 DIAGNOSIS — Z79811 Long term (current) use of aromatase inhibitors: Secondary | ICD-10-CM | POA: Diagnosis not present

## 2022-03-25 DIAGNOSIS — Z9012 Acquired absence of left breast and nipple: Secondary | ICD-10-CM | POA: Diagnosis not present

## 2022-03-25 DIAGNOSIS — J449 Chronic obstructive pulmonary disease, unspecified: Secondary | ICD-10-CM | POA: Diagnosis not present

## 2022-03-25 DIAGNOSIS — Z510 Encounter for antineoplastic radiation therapy: Secondary | ICD-10-CM | POA: Diagnosis not present

## 2022-03-25 DIAGNOSIS — Z780 Asymptomatic menopausal state: Secondary | ICD-10-CM | POA: Diagnosis not present

## 2022-03-25 DIAGNOSIS — C50912 Malignant neoplasm of unspecified site of left female breast: Secondary | ICD-10-CM | POA: Diagnosis not present

## 2022-03-25 DIAGNOSIS — Z170 Estrogen receptor positive status [ER+]: Secondary | ICD-10-CM | POA: Diagnosis not present

## 2022-03-25 DIAGNOSIS — F17210 Nicotine dependence, cigarettes, uncomplicated: Secondary | ICD-10-CM | POA: Diagnosis not present

## 2022-03-25 DIAGNOSIS — C50812 Malignant neoplasm of overlapping sites of left female breast: Secondary | ICD-10-CM | POA: Diagnosis not present

## 2022-03-25 DIAGNOSIS — C7989 Secondary malignant neoplasm of other specified sites: Secondary | ICD-10-CM | POA: Diagnosis not present

## 2022-03-26 DIAGNOSIS — F17210 Nicotine dependence, cigarettes, uncomplicated: Secondary | ICD-10-CM | POA: Diagnosis not present

## 2022-03-26 DIAGNOSIS — Z170 Estrogen receptor positive status [ER+]: Secondary | ICD-10-CM | POA: Diagnosis not present

## 2022-03-26 DIAGNOSIS — C50912 Malignant neoplasm of unspecified site of left female breast: Secondary | ICD-10-CM | POA: Diagnosis not present

## 2022-03-26 DIAGNOSIS — C50812 Malignant neoplasm of overlapping sites of left female breast: Secondary | ICD-10-CM | POA: Diagnosis not present

## 2022-03-26 DIAGNOSIS — Z780 Asymptomatic menopausal state: Secondary | ICD-10-CM | POA: Diagnosis not present

## 2022-03-26 DIAGNOSIS — C7989 Secondary malignant neoplasm of other specified sites: Secondary | ICD-10-CM | POA: Diagnosis not present

## 2022-03-26 DIAGNOSIS — Z9012 Acquired absence of left breast and nipple: Secondary | ICD-10-CM | POA: Diagnosis not present

## 2022-03-26 DIAGNOSIS — Z510 Encounter for antineoplastic radiation therapy: Secondary | ICD-10-CM | POA: Diagnosis not present

## 2022-03-26 DIAGNOSIS — Z79811 Long term (current) use of aromatase inhibitors: Secondary | ICD-10-CM | POA: Diagnosis not present

## 2022-03-26 DIAGNOSIS — J449 Chronic obstructive pulmonary disease, unspecified: Secondary | ICD-10-CM | POA: Diagnosis not present

## 2022-03-27 DIAGNOSIS — Z780 Asymptomatic menopausal state: Secondary | ICD-10-CM | POA: Diagnosis not present

## 2022-03-27 DIAGNOSIS — C7989 Secondary malignant neoplasm of other specified sites: Secondary | ICD-10-CM | POA: Diagnosis not present

## 2022-03-27 DIAGNOSIS — Z170 Estrogen receptor positive status [ER+]: Secondary | ICD-10-CM | POA: Diagnosis not present

## 2022-03-27 DIAGNOSIS — Z510 Encounter for antineoplastic radiation therapy: Secondary | ICD-10-CM | POA: Diagnosis not present

## 2022-03-27 DIAGNOSIS — J449 Chronic obstructive pulmonary disease, unspecified: Secondary | ICD-10-CM | POA: Diagnosis not present

## 2022-03-27 DIAGNOSIS — C50812 Malignant neoplasm of overlapping sites of left female breast: Secondary | ICD-10-CM | POA: Diagnosis not present

## 2022-03-27 DIAGNOSIS — C50912 Malignant neoplasm of unspecified site of left female breast: Secondary | ICD-10-CM | POA: Diagnosis not present

## 2022-03-27 DIAGNOSIS — Z9012 Acquired absence of left breast and nipple: Secondary | ICD-10-CM | POA: Diagnosis not present

## 2022-03-27 DIAGNOSIS — F17210 Nicotine dependence, cigarettes, uncomplicated: Secondary | ICD-10-CM | POA: Diagnosis not present

## 2022-03-27 DIAGNOSIS — Z79811 Long term (current) use of aromatase inhibitors: Secondary | ICD-10-CM | POA: Diagnosis not present

## 2022-03-30 DIAGNOSIS — F17210 Nicotine dependence, cigarettes, uncomplicated: Secondary | ICD-10-CM | POA: Diagnosis not present

## 2022-03-30 DIAGNOSIS — C50912 Malignant neoplasm of unspecified site of left female breast: Secondary | ICD-10-CM | POA: Diagnosis not present

## 2022-03-30 DIAGNOSIS — Z79811 Long term (current) use of aromatase inhibitors: Secondary | ICD-10-CM | POA: Diagnosis not present

## 2022-03-30 DIAGNOSIS — J449 Chronic obstructive pulmonary disease, unspecified: Secondary | ICD-10-CM | POA: Diagnosis not present

## 2022-03-30 DIAGNOSIS — C7989 Secondary malignant neoplasm of other specified sites: Secondary | ICD-10-CM | POA: Diagnosis not present

## 2022-03-30 DIAGNOSIS — Z170 Estrogen receptor positive status [ER+]: Secondary | ICD-10-CM | POA: Diagnosis not present

## 2022-03-30 DIAGNOSIS — Z780 Asymptomatic menopausal state: Secondary | ICD-10-CM | POA: Diagnosis not present

## 2022-03-30 DIAGNOSIS — Z510 Encounter for antineoplastic radiation therapy: Secondary | ICD-10-CM | POA: Diagnosis not present

## 2022-03-30 DIAGNOSIS — C50812 Malignant neoplasm of overlapping sites of left female breast: Secondary | ICD-10-CM | POA: Diagnosis not present

## 2022-03-30 DIAGNOSIS — Z9012 Acquired absence of left breast and nipple: Secondary | ICD-10-CM | POA: Diagnosis not present

## 2022-03-31 DIAGNOSIS — Z170 Estrogen receptor positive status [ER+]: Secondary | ICD-10-CM | POA: Diagnosis not present

## 2022-03-31 DIAGNOSIS — Z780 Asymptomatic menopausal state: Secondary | ICD-10-CM | POA: Diagnosis not present

## 2022-03-31 DIAGNOSIS — C50912 Malignant neoplasm of unspecified site of left female breast: Secondary | ICD-10-CM | POA: Diagnosis not present

## 2022-03-31 DIAGNOSIS — F17210 Nicotine dependence, cigarettes, uncomplicated: Secondary | ICD-10-CM | POA: Diagnosis not present

## 2022-03-31 DIAGNOSIS — C50812 Malignant neoplasm of overlapping sites of left female breast: Secondary | ICD-10-CM | POA: Diagnosis not present

## 2022-03-31 DIAGNOSIS — J449 Chronic obstructive pulmonary disease, unspecified: Secondary | ICD-10-CM | POA: Diagnosis not present

## 2022-03-31 DIAGNOSIS — Z79811 Long term (current) use of aromatase inhibitors: Secondary | ICD-10-CM | POA: Diagnosis not present

## 2022-03-31 DIAGNOSIS — Z510 Encounter for antineoplastic radiation therapy: Secondary | ICD-10-CM | POA: Diagnosis not present

## 2022-03-31 DIAGNOSIS — C7989 Secondary malignant neoplasm of other specified sites: Secondary | ICD-10-CM | POA: Diagnosis not present

## 2022-03-31 DIAGNOSIS — Z9012 Acquired absence of left breast and nipple: Secondary | ICD-10-CM | POA: Diagnosis not present

## 2022-04-01 ENCOUNTER — Other Ambulatory Visit: Payer: Self-pay

## 2022-04-01 DIAGNOSIS — Z170 Estrogen receptor positive status [ER+]: Secondary | ICD-10-CM | POA: Diagnosis not present

## 2022-04-01 DIAGNOSIS — Z510 Encounter for antineoplastic radiation therapy: Secondary | ICD-10-CM | POA: Diagnosis not present

## 2022-04-01 DIAGNOSIS — C50912 Malignant neoplasm of unspecified site of left female breast: Secondary | ICD-10-CM | POA: Diagnosis not present

## 2022-04-01 DIAGNOSIS — C50812 Malignant neoplasm of overlapping sites of left female breast: Secondary | ICD-10-CM | POA: Diagnosis not present

## 2022-04-01 DIAGNOSIS — J449 Chronic obstructive pulmonary disease, unspecified: Secondary | ICD-10-CM | POA: Diagnosis not present

## 2022-04-01 DIAGNOSIS — Z79811 Long term (current) use of aromatase inhibitors: Secondary | ICD-10-CM | POA: Diagnosis not present

## 2022-04-01 DIAGNOSIS — F17210 Nicotine dependence, cigarettes, uncomplicated: Secondary | ICD-10-CM | POA: Diagnosis not present

## 2022-04-01 DIAGNOSIS — C7989 Secondary malignant neoplasm of other specified sites: Secondary | ICD-10-CM | POA: Diagnosis not present

## 2022-04-01 DIAGNOSIS — Z780 Asymptomatic menopausal state: Secondary | ICD-10-CM | POA: Diagnosis not present

## 2022-04-01 DIAGNOSIS — Z9012 Acquired absence of left breast and nipple: Secondary | ICD-10-CM | POA: Diagnosis not present

## 2022-04-02 DIAGNOSIS — Z79811 Long term (current) use of aromatase inhibitors: Secondary | ICD-10-CM | POA: Diagnosis not present

## 2022-04-02 DIAGNOSIS — Z510 Encounter for antineoplastic radiation therapy: Secondary | ICD-10-CM | POA: Diagnosis not present

## 2022-04-02 DIAGNOSIS — C50912 Malignant neoplasm of unspecified site of left female breast: Secondary | ICD-10-CM | POA: Diagnosis not present

## 2022-04-02 DIAGNOSIS — Z780 Asymptomatic menopausal state: Secondary | ICD-10-CM | POA: Diagnosis not present

## 2022-04-02 DIAGNOSIS — C50812 Malignant neoplasm of overlapping sites of left female breast: Secondary | ICD-10-CM | POA: Diagnosis not present

## 2022-04-02 DIAGNOSIS — F17210 Nicotine dependence, cigarettes, uncomplicated: Secondary | ICD-10-CM | POA: Diagnosis not present

## 2022-04-02 DIAGNOSIS — Z9012 Acquired absence of left breast and nipple: Secondary | ICD-10-CM | POA: Diagnosis not present

## 2022-04-02 DIAGNOSIS — C7989 Secondary malignant neoplasm of other specified sites: Secondary | ICD-10-CM | POA: Diagnosis not present

## 2022-04-02 DIAGNOSIS — Z170 Estrogen receptor positive status [ER+]: Secondary | ICD-10-CM | POA: Diagnosis not present

## 2022-04-02 DIAGNOSIS — J449 Chronic obstructive pulmonary disease, unspecified: Secondary | ICD-10-CM | POA: Diagnosis not present

## 2022-04-06 DIAGNOSIS — Z780 Asymptomatic menopausal state: Secondary | ICD-10-CM | POA: Diagnosis not present

## 2022-04-06 DIAGNOSIS — Z9012 Acquired absence of left breast and nipple: Secondary | ICD-10-CM | POA: Diagnosis not present

## 2022-04-06 DIAGNOSIS — C50912 Malignant neoplasm of unspecified site of left female breast: Secondary | ICD-10-CM | POA: Diagnosis not present

## 2022-04-06 DIAGNOSIS — C7989 Secondary malignant neoplasm of other specified sites: Secondary | ICD-10-CM | POA: Diagnosis not present

## 2022-04-06 DIAGNOSIS — Z170 Estrogen receptor positive status [ER+]: Secondary | ICD-10-CM | POA: Diagnosis not present

## 2022-04-06 DIAGNOSIS — F17210 Nicotine dependence, cigarettes, uncomplicated: Secondary | ICD-10-CM | POA: Diagnosis not present

## 2022-04-06 DIAGNOSIS — Z510 Encounter for antineoplastic radiation therapy: Secondary | ICD-10-CM | POA: Diagnosis not present

## 2022-04-06 DIAGNOSIS — J449 Chronic obstructive pulmonary disease, unspecified: Secondary | ICD-10-CM | POA: Diagnosis not present

## 2022-04-06 DIAGNOSIS — C50812 Malignant neoplasm of overlapping sites of left female breast: Secondary | ICD-10-CM | POA: Diagnosis not present

## 2022-04-06 DIAGNOSIS — Z79811 Long term (current) use of aromatase inhibitors: Secondary | ICD-10-CM | POA: Diagnosis not present

## 2022-04-07 DIAGNOSIS — Z170 Estrogen receptor positive status [ER+]: Secondary | ICD-10-CM | POA: Diagnosis not present

## 2022-04-07 DIAGNOSIS — C50912 Malignant neoplasm of unspecified site of left female breast: Secondary | ICD-10-CM | POA: Diagnosis not present

## 2022-04-07 DIAGNOSIS — Z780 Asymptomatic menopausal state: Secondary | ICD-10-CM | POA: Diagnosis not present

## 2022-04-07 DIAGNOSIS — J449 Chronic obstructive pulmonary disease, unspecified: Secondary | ICD-10-CM | POA: Diagnosis not present

## 2022-04-07 DIAGNOSIS — Z510 Encounter for antineoplastic radiation therapy: Secondary | ICD-10-CM | POA: Diagnosis not present

## 2022-04-07 DIAGNOSIS — Z79811 Long term (current) use of aromatase inhibitors: Secondary | ICD-10-CM | POA: Diagnosis not present

## 2022-04-07 DIAGNOSIS — C7989 Secondary malignant neoplasm of other specified sites: Secondary | ICD-10-CM | POA: Diagnosis not present

## 2022-04-07 DIAGNOSIS — F17210 Nicotine dependence, cigarettes, uncomplicated: Secondary | ICD-10-CM | POA: Diagnosis not present

## 2022-04-07 DIAGNOSIS — C50812 Malignant neoplasm of overlapping sites of left female breast: Secondary | ICD-10-CM | POA: Diagnosis not present

## 2022-04-07 DIAGNOSIS — Z9012 Acquired absence of left breast and nipple: Secondary | ICD-10-CM | POA: Diagnosis not present

## 2022-04-08 DIAGNOSIS — Z780 Asymptomatic menopausal state: Secondary | ICD-10-CM | POA: Diagnosis not present

## 2022-04-08 DIAGNOSIS — J449 Chronic obstructive pulmonary disease, unspecified: Secondary | ICD-10-CM | POA: Diagnosis not present

## 2022-04-08 DIAGNOSIS — Z170 Estrogen receptor positive status [ER+]: Secondary | ICD-10-CM | POA: Diagnosis not present

## 2022-04-08 DIAGNOSIS — C50912 Malignant neoplasm of unspecified site of left female breast: Secondary | ICD-10-CM | POA: Diagnosis not present

## 2022-04-08 DIAGNOSIS — F17210 Nicotine dependence, cigarettes, uncomplicated: Secondary | ICD-10-CM | POA: Diagnosis not present

## 2022-04-08 DIAGNOSIS — Z79811 Long term (current) use of aromatase inhibitors: Secondary | ICD-10-CM | POA: Diagnosis not present

## 2022-04-08 DIAGNOSIS — C7989 Secondary malignant neoplasm of other specified sites: Secondary | ICD-10-CM | POA: Diagnosis not present

## 2022-04-08 DIAGNOSIS — Z9012 Acquired absence of left breast and nipple: Secondary | ICD-10-CM | POA: Diagnosis not present

## 2022-04-08 DIAGNOSIS — C50812 Malignant neoplasm of overlapping sites of left female breast: Secondary | ICD-10-CM | POA: Diagnosis not present

## 2022-04-08 DIAGNOSIS — Z510 Encounter for antineoplastic radiation therapy: Secondary | ICD-10-CM | POA: Diagnosis not present

## 2022-04-09 DIAGNOSIS — Z79811 Long term (current) use of aromatase inhibitors: Secondary | ICD-10-CM | POA: Diagnosis not present

## 2022-04-09 DIAGNOSIS — Z170 Estrogen receptor positive status [ER+]: Secondary | ICD-10-CM | POA: Diagnosis not present

## 2022-04-09 DIAGNOSIS — Z9012 Acquired absence of left breast and nipple: Secondary | ICD-10-CM | POA: Diagnosis not present

## 2022-04-09 DIAGNOSIS — C7989 Secondary malignant neoplasm of other specified sites: Secondary | ICD-10-CM | POA: Diagnosis not present

## 2022-04-09 DIAGNOSIS — Z510 Encounter for antineoplastic radiation therapy: Secondary | ICD-10-CM | POA: Diagnosis not present

## 2022-04-09 DIAGNOSIS — C50912 Malignant neoplasm of unspecified site of left female breast: Secondary | ICD-10-CM | POA: Diagnosis not present

## 2022-04-09 DIAGNOSIS — C50812 Malignant neoplasm of overlapping sites of left female breast: Secondary | ICD-10-CM | POA: Diagnosis not present

## 2022-04-09 DIAGNOSIS — F17210 Nicotine dependence, cigarettes, uncomplicated: Secondary | ICD-10-CM | POA: Diagnosis not present

## 2022-04-09 DIAGNOSIS — Z780 Asymptomatic menopausal state: Secondary | ICD-10-CM | POA: Diagnosis not present

## 2022-04-09 DIAGNOSIS — J449 Chronic obstructive pulmonary disease, unspecified: Secondary | ICD-10-CM | POA: Diagnosis not present

## 2022-04-10 DIAGNOSIS — Z79811 Long term (current) use of aromatase inhibitors: Secondary | ICD-10-CM | POA: Diagnosis not present

## 2022-04-10 DIAGNOSIS — F1320 Sedative, hypnotic or anxiolytic dependence, uncomplicated: Secondary | ICD-10-CM | POA: Diagnosis not present

## 2022-04-10 DIAGNOSIS — C50812 Malignant neoplasm of overlapping sites of left female breast: Secondary | ICD-10-CM | POA: Diagnosis not present

## 2022-04-10 DIAGNOSIS — C7989 Secondary malignant neoplasm of other specified sites: Secondary | ICD-10-CM | POA: Diagnosis not present

## 2022-04-10 DIAGNOSIS — Z299 Encounter for prophylactic measures, unspecified: Secondary | ICD-10-CM | POA: Diagnosis not present

## 2022-04-10 DIAGNOSIS — Z510 Encounter for antineoplastic radiation therapy: Secondary | ICD-10-CM | POA: Diagnosis not present

## 2022-04-10 DIAGNOSIS — C50912 Malignant neoplasm of unspecified site of left female breast: Secondary | ICD-10-CM | POA: Diagnosis not present

## 2022-04-10 DIAGNOSIS — Z170 Estrogen receptor positive status [ER+]: Secondary | ICD-10-CM | POA: Diagnosis not present

## 2022-04-10 DIAGNOSIS — J441 Chronic obstructive pulmonary disease with (acute) exacerbation: Secondary | ICD-10-CM | POA: Diagnosis not present

## 2022-04-10 DIAGNOSIS — J449 Chronic obstructive pulmonary disease, unspecified: Secondary | ICD-10-CM | POA: Diagnosis not present

## 2022-04-10 DIAGNOSIS — Z9012 Acquired absence of left breast and nipple: Secondary | ICD-10-CM | POA: Diagnosis not present

## 2022-04-10 DIAGNOSIS — J209 Acute bronchitis, unspecified: Secondary | ICD-10-CM | POA: Diagnosis not present

## 2022-04-10 DIAGNOSIS — F17210 Nicotine dependence, cigarettes, uncomplicated: Secondary | ICD-10-CM | POA: Diagnosis not present

## 2022-04-10 DIAGNOSIS — J440 Chronic obstructive pulmonary disease with acute lower respiratory infection: Secondary | ICD-10-CM | POA: Diagnosis not present

## 2022-04-10 DIAGNOSIS — Z780 Asymptomatic menopausal state: Secondary | ICD-10-CM | POA: Diagnosis not present

## 2022-04-13 DIAGNOSIS — Z9012 Acquired absence of left breast and nipple: Secondary | ICD-10-CM | POA: Diagnosis not present

## 2022-04-13 DIAGNOSIS — Z79811 Long term (current) use of aromatase inhibitors: Secondary | ICD-10-CM | POA: Diagnosis not present

## 2022-04-13 DIAGNOSIS — Z510 Encounter for antineoplastic radiation therapy: Secondary | ICD-10-CM | POA: Diagnosis not present

## 2022-04-13 DIAGNOSIS — C50912 Malignant neoplasm of unspecified site of left female breast: Secondary | ICD-10-CM | POA: Diagnosis not present

## 2022-04-13 DIAGNOSIS — F17210 Nicotine dependence, cigarettes, uncomplicated: Secondary | ICD-10-CM | POA: Diagnosis not present

## 2022-04-13 DIAGNOSIS — Z780 Asymptomatic menopausal state: Secondary | ICD-10-CM | POA: Diagnosis not present

## 2022-04-13 DIAGNOSIS — J449 Chronic obstructive pulmonary disease, unspecified: Secondary | ICD-10-CM | POA: Diagnosis not present

## 2022-04-13 DIAGNOSIS — C50812 Malignant neoplasm of overlapping sites of left female breast: Secondary | ICD-10-CM | POA: Diagnosis not present

## 2022-04-13 DIAGNOSIS — Z170 Estrogen receptor positive status [ER+]: Secondary | ICD-10-CM | POA: Diagnosis not present

## 2022-04-13 DIAGNOSIS — C7989 Secondary malignant neoplasm of other specified sites: Secondary | ICD-10-CM | POA: Diagnosis not present

## 2022-04-14 DIAGNOSIS — C50912 Malignant neoplasm of unspecified site of left female breast: Secondary | ICD-10-CM | POA: Diagnosis not present

## 2022-04-14 DIAGNOSIS — J449 Chronic obstructive pulmonary disease, unspecified: Secondary | ICD-10-CM | POA: Diagnosis not present

## 2022-04-14 DIAGNOSIS — Z79811 Long term (current) use of aromatase inhibitors: Secondary | ICD-10-CM | POA: Diagnosis not present

## 2022-04-14 DIAGNOSIS — C7989 Secondary malignant neoplasm of other specified sites: Secondary | ICD-10-CM | POA: Diagnosis not present

## 2022-04-14 DIAGNOSIS — Z170 Estrogen receptor positive status [ER+]: Secondary | ICD-10-CM | POA: Diagnosis not present

## 2022-04-14 DIAGNOSIS — Z510 Encounter for antineoplastic radiation therapy: Secondary | ICD-10-CM | POA: Diagnosis not present

## 2022-04-14 DIAGNOSIS — C50812 Malignant neoplasm of overlapping sites of left female breast: Secondary | ICD-10-CM | POA: Diagnosis not present

## 2022-04-14 DIAGNOSIS — Z9012 Acquired absence of left breast and nipple: Secondary | ICD-10-CM | POA: Diagnosis not present

## 2022-04-14 DIAGNOSIS — Z780 Asymptomatic menopausal state: Secondary | ICD-10-CM | POA: Diagnosis not present

## 2022-04-14 DIAGNOSIS — F17210 Nicotine dependence, cigarettes, uncomplicated: Secondary | ICD-10-CM | POA: Diagnosis not present

## 2022-04-15 DIAGNOSIS — Z170 Estrogen receptor positive status [ER+]: Secondary | ICD-10-CM | POA: Diagnosis not present

## 2022-04-15 DIAGNOSIS — C50812 Malignant neoplasm of overlapping sites of left female breast: Secondary | ICD-10-CM | POA: Diagnosis not present

## 2022-04-15 DIAGNOSIS — Z9012 Acquired absence of left breast and nipple: Secondary | ICD-10-CM | POA: Diagnosis not present

## 2022-04-15 DIAGNOSIS — Z510 Encounter for antineoplastic radiation therapy: Secondary | ICD-10-CM | POA: Diagnosis not present

## 2022-04-15 DIAGNOSIS — C7989 Secondary malignant neoplasm of other specified sites: Secondary | ICD-10-CM | POA: Diagnosis not present

## 2022-04-15 DIAGNOSIS — Z780 Asymptomatic menopausal state: Secondary | ICD-10-CM | POA: Diagnosis not present

## 2022-04-15 DIAGNOSIS — F17210 Nicotine dependence, cigarettes, uncomplicated: Secondary | ICD-10-CM | POA: Diagnosis not present

## 2022-04-15 DIAGNOSIS — C50912 Malignant neoplasm of unspecified site of left female breast: Secondary | ICD-10-CM | POA: Diagnosis not present

## 2022-04-15 DIAGNOSIS — Z79811 Long term (current) use of aromatase inhibitors: Secondary | ICD-10-CM | POA: Diagnosis not present

## 2022-04-15 DIAGNOSIS — J449 Chronic obstructive pulmonary disease, unspecified: Secondary | ICD-10-CM | POA: Diagnosis not present

## 2022-04-16 DIAGNOSIS — J449 Chronic obstructive pulmonary disease, unspecified: Secondary | ICD-10-CM | POA: Diagnosis not present

## 2022-04-16 DIAGNOSIS — Z780 Asymptomatic menopausal state: Secondary | ICD-10-CM | POA: Diagnosis not present

## 2022-04-16 DIAGNOSIS — Z79811 Long term (current) use of aromatase inhibitors: Secondary | ICD-10-CM | POA: Diagnosis not present

## 2022-04-16 DIAGNOSIS — Z510 Encounter for antineoplastic radiation therapy: Secondary | ICD-10-CM | POA: Diagnosis not present

## 2022-04-16 DIAGNOSIS — C50812 Malignant neoplasm of overlapping sites of left female breast: Secondary | ICD-10-CM | POA: Diagnosis not present

## 2022-04-16 DIAGNOSIS — C7989 Secondary malignant neoplasm of other specified sites: Secondary | ICD-10-CM | POA: Diagnosis not present

## 2022-04-16 DIAGNOSIS — Z170 Estrogen receptor positive status [ER+]: Secondary | ICD-10-CM | POA: Diagnosis not present

## 2022-04-16 DIAGNOSIS — F17210 Nicotine dependence, cigarettes, uncomplicated: Secondary | ICD-10-CM | POA: Diagnosis not present

## 2022-04-16 DIAGNOSIS — Z9012 Acquired absence of left breast and nipple: Secondary | ICD-10-CM | POA: Diagnosis not present

## 2022-04-16 DIAGNOSIS — C50912 Malignant neoplasm of unspecified site of left female breast: Secondary | ICD-10-CM | POA: Diagnosis not present

## 2022-04-20 DIAGNOSIS — F17210 Nicotine dependence, cigarettes, uncomplicated: Secondary | ICD-10-CM | POA: Diagnosis not present

## 2022-04-20 DIAGNOSIS — C7989 Secondary malignant neoplasm of other specified sites: Secondary | ICD-10-CM | POA: Diagnosis not present

## 2022-04-20 DIAGNOSIS — C50912 Malignant neoplasm of unspecified site of left female breast: Secondary | ICD-10-CM | POA: Diagnosis not present

## 2022-04-20 DIAGNOSIS — Z780 Asymptomatic menopausal state: Secondary | ICD-10-CM | POA: Diagnosis not present

## 2022-04-20 DIAGNOSIS — Z510 Encounter for antineoplastic radiation therapy: Secondary | ICD-10-CM | POA: Diagnosis not present

## 2022-04-20 DIAGNOSIS — Z79811 Long term (current) use of aromatase inhibitors: Secondary | ICD-10-CM | POA: Diagnosis not present

## 2022-04-20 DIAGNOSIS — C50812 Malignant neoplasm of overlapping sites of left female breast: Secondary | ICD-10-CM | POA: Diagnosis not present

## 2022-04-20 DIAGNOSIS — Z170 Estrogen receptor positive status [ER+]: Secondary | ICD-10-CM | POA: Diagnosis not present

## 2022-04-20 DIAGNOSIS — J449 Chronic obstructive pulmonary disease, unspecified: Secondary | ICD-10-CM | POA: Diagnosis not present

## 2022-04-20 DIAGNOSIS — Z9012 Acquired absence of left breast and nipple: Secondary | ICD-10-CM | POA: Diagnosis not present

## 2022-04-21 DIAGNOSIS — J449 Chronic obstructive pulmonary disease, unspecified: Secondary | ICD-10-CM | POA: Diagnosis not present

## 2022-04-21 DIAGNOSIS — C50912 Malignant neoplasm of unspecified site of left female breast: Secondary | ICD-10-CM | POA: Diagnosis not present

## 2022-04-21 DIAGNOSIS — C7989 Secondary malignant neoplasm of other specified sites: Secondary | ICD-10-CM | POA: Diagnosis not present

## 2022-04-21 DIAGNOSIS — Z9012 Acquired absence of left breast and nipple: Secondary | ICD-10-CM | POA: Diagnosis not present

## 2022-04-21 DIAGNOSIS — Z510 Encounter for antineoplastic radiation therapy: Secondary | ICD-10-CM | POA: Diagnosis not present

## 2022-04-21 DIAGNOSIS — F17210 Nicotine dependence, cigarettes, uncomplicated: Secondary | ICD-10-CM | POA: Diagnosis not present

## 2022-04-21 DIAGNOSIS — Z79811 Long term (current) use of aromatase inhibitors: Secondary | ICD-10-CM | POA: Diagnosis not present

## 2022-04-21 DIAGNOSIS — Z170 Estrogen receptor positive status [ER+]: Secondary | ICD-10-CM | POA: Diagnosis not present

## 2022-04-21 DIAGNOSIS — C50812 Malignant neoplasm of overlapping sites of left female breast: Secondary | ICD-10-CM | POA: Diagnosis not present

## 2022-04-21 DIAGNOSIS — Z780 Asymptomatic menopausal state: Secondary | ICD-10-CM | POA: Diagnosis not present

## 2022-04-22 DIAGNOSIS — C50912 Malignant neoplasm of unspecified site of left female breast: Secondary | ICD-10-CM | POA: Diagnosis not present

## 2022-04-22 DIAGNOSIS — Z9012 Acquired absence of left breast and nipple: Secondary | ICD-10-CM | POA: Diagnosis not present

## 2022-04-22 DIAGNOSIS — Z79811 Long term (current) use of aromatase inhibitors: Secondary | ICD-10-CM | POA: Diagnosis not present

## 2022-04-22 DIAGNOSIS — C7989 Secondary malignant neoplasm of other specified sites: Secondary | ICD-10-CM | POA: Diagnosis not present

## 2022-04-22 DIAGNOSIS — Z170 Estrogen receptor positive status [ER+]: Secondary | ICD-10-CM | POA: Diagnosis not present

## 2022-04-22 DIAGNOSIS — J449 Chronic obstructive pulmonary disease, unspecified: Secondary | ICD-10-CM | POA: Diagnosis not present

## 2022-04-22 DIAGNOSIS — Z510 Encounter for antineoplastic radiation therapy: Secondary | ICD-10-CM | POA: Diagnosis not present

## 2022-04-22 DIAGNOSIS — F17210 Nicotine dependence, cigarettes, uncomplicated: Secondary | ICD-10-CM | POA: Diagnosis not present

## 2022-04-22 DIAGNOSIS — C50812 Malignant neoplasm of overlapping sites of left female breast: Secondary | ICD-10-CM | POA: Diagnosis not present

## 2022-04-22 DIAGNOSIS — Z780 Asymptomatic menopausal state: Secondary | ICD-10-CM | POA: Diagnosis not present

## 2022-04-24 DIAGNOSIS — Z79811 Long term (current) use of aromatase inhibitors: Secondary | ICD-10-CM | POA: Diagnosis not present

## 2022-04-24 DIAGNOSIS — C7989 Secondary malignant neoplasm of other specified sites: Secondary | ICD-10-CM | POA: Diagnosis not present

## 2022-04-24 DIAGNOSIS — Z170 Estrogen receptor positive status [ER+]: Secondary | ICD-10-CM | POA: Diagnosis not present

## 2022-04-24 DIAGNOSIS — F17210 Nicotine dependence, cigarettes, uncomplicated: Secondary | ICD-10-CM | POA: Diagnosis not present

## 2022-04-24 DIAGNOSIS — Z510 Encounter for antineoplastic radiation therapy: Secondary | ICD-10-CM | POA: Diagnosis not present

## 2022-04-24 DIAGNOSIS — J449 Chronic obstructive pulmonary disease, unspecified: Secondary | ICD-10-CM | POA: Diagnosis not present

## 2022-04-24 DIAGNOSIS — Z9012 Acquired absence of left breast and nipple: Secondary | ICD-10-CM | POA: Diagnosis not present

## 2022-04-24 DIAGNOSIS — C50912 Malignant neoplasm of unspecified site of left female breast: Secondary | ICD-10-CM | POA: Diagnosis not present

## 2022-04-24 DIAGNOSIS — C50812 Malignant neoplasm of overlapping sites of left female breast: Secondary | ICD-10-CM | POA: Diagnosis not present

## 2022-04-24 DIAGNOSIS — Z780 Asymptomatic menopausal state: Secondary | ICD-10-CM | POA: Diagnosis not present

## 2022-04-27 DIAGNOSIS — Z780 Asymptomatic menopausal state: Secondary | ICD-10-CM | POA: Diagnosis not present

## 2022-04-27 DIAGNOSIS — F17210 Nicotine dependence, cigarettes, uncomplicated: Secondary | ICD-10-CM | POA: Diagnosis not present

## 2022-04-27 DIAGNOSIS — Z9012 Acquired absence of left breast and nipple: Secondary | ICD-10-CM | POA: Diagnosis not present

## 2022-04-27 DIAGNOSIS — C50912 Malignant neoplasm of unspecified site of left female breast: Secondary | ICD-10-CM | POA: Diagnosis not present

## 2022-04-27 DIAGNOSIS — C50812 Malignant neoplasm of overlapping sites of left female breast: Secondary | ICD-10-CM | POA: Diagnosis not present

## 2022-04-27 DIAGNOSIS — Z79811 Long term (current) use of aromatase inhibitors: Secondary | ICD-10-CM | POA: Diagnosis not present

## 2022-04-27 DIAGNOSIS — Z170 Estrogen receptor positive status [ER+]: Secondary | ICD-10-CM | POA: Diagnosis not present

## 2022-04-27 DIAGNOSIS — C7989 Secondary malignant neoplasm of other specified sites: Secondary | ICD-10-CM | POA: Diagnosis not present

## 2022-04-27 DIAGNOSIS — J449 Chronic obstructive pulmonary disease, unspecified: Secondary | ICD-10-CM | POA: Diagnosis not present

## 2022-04-27 DIAGNOSIS — Z510 Encounter for antineoplastic radiation therapy: Secondary | ICD-10-CM | POA: Diagnosis not present

## 2022-04-28 DIAGNOSIS — F17210 Nicotine dependence, cigarettes, uncomplicated: Secondary | ICD-10-CM | POA: Diagnosis not present

## 2022-04-28 DIAGNOSIS — Z9012 Acquired absence of left breast and nipple: Secondary | ICD-10-CM | POA: Diagnosis not present

## 2022-04-28 DIAGNOSIS — Z79811 Long term (current) use of aromatase inhibitors: Secondary | ICD-10-CM | POA: Diagnosis not present

## 2022-04-28 DIAGNOSIS — Z510 Encounter for antineoplastic radiation therapy: Secondary | ICD-10-CM | POA: Diagnosis not present

## 2022-04-28 DIAGNOSIS — C50812 Malignant neoplasm of overlapping sites of left female breast: Secondary | ICD-10-CM | POA: Diagnosis not present

## 2022-04-28 DIAGNOSIS — J449 Chronic obstructive pulmonary disease, unspecified: Secondary | ICD-10-CM | POA: Diagnosis not present

## 2022-04-28 DIAGNOSIS — C50912 Malignant neoplasm of unspecified site of left female breast: Secondary | ICD-10-CM | POA: Diagnosis not present

## 2022-04-28 DIAGNOSIS — Z170 Estrogen receptor positive status [ER+]: Secondary | ICD-10-CM | POA: Diagnosis not present

## 2022-04-28 DIAGNOSIS — C7989 Secondary malignant neoplasm of other specified sites: Secondary | ICD-10-CM | POA: Diagnosis not present

## 2022-04-28 DIAGNOSIS — Z780 Asymptomatic menopausal state: Secondary | ICD-10-CM | POA: Diagnosis not present

## 2022-04-29 ENCOUNTER — Ambulatory Visit (HOSPITAL_COMMUNITY): Payer: Medicare PPO

## 2022-04-29 DIAGNOSIS — Z299 Encounter for prophylactic measures, unspecified: Secondary | ICD-10-CM | POA: Diagnosis not present

## 2022-04-29 DIAGNOSIS — D692 Other nonthrombocytopenic purpura: Secondary | ICD-10-CM | POA: Diagnosis not present

## 2022-04-29 DIAGNOSIS — J209 Acute bronchitis, unspecified: Secondary | ICD-10-CM | POA: Diagnosis not present

## 2022-04-29 DIAGNOSIS — J440 Chronic obstructive pulmonary disease with acute lower respiratory infection: Secondary | ICD-10-CM | POA: Diagnosis not present

## 2022-04-30 DIAGNOSIS — Z170 Estrogen receptor positive status [ER+]: Secondary | ICD-10-CM | POA: Diagnosis not present

## 2022-04-30 DIAGNOSIS — G894 Chronic pain syndrome: Secondary | ICD-10-CM | POA: Diagnosis not present

## 2022-04-30 DIAGNOSIS — C50812 Malignant neoplasm of overlapping sites of left female breast: Secondary | ICD-10-CM | POA: Diagnosis not present

## 2022-04-30 DIAGNOSIS — C50912 Malignant neoplasm of unspecified site of left female breast: Secondary | ICD-10-CM | POA: Diagnosis not present

## 2022-04-30 DIAGNOSIS — Z510 Encounter for antineoplastic radiation therapy: Secondary | ICD-10-CM | POA: Diagnosis not present

## 2022-04-30 DIAGNOSIS — M5416 Radiculopathy, lumbar region: Secondary | ICD-10-CM | POA: Diagnosis not present

## 2022-04-30 DIAGNOSIS — F17210 Nicotine dependence, cigarettes, uncomplicated: Secondary | ICD-10-CM | POA: Diagnosis not present

## 2022-04-30 DIAGNOSIS — Z79811 Long term (current) use of aromatase inhibitors: Secondary | ICD-10-CM | POA: Diagnosis not present

## 2022-04-30 DIAGNOSIS — C7989 Secondary malignant neoplasm of other specified sites: Secondary | ICD-10-CM | POA: Diagnosis not present

## 2022-04-30 DIAGNOSIS — M461 Sacroiliitis, not elsewhere classified: Secondary | ICD-10-CM | POA: Diagnosis not present

## 2022-04-30 DIAGNOSIS — Z9012 Acquired absence of left breast and nipple: Secondary | ICD-10-CM | POA: Diagnosis not present

## 2022-04-30 DIAGNOSIS — J449 Chronic obstructive pulmonary disease, unspecified: Secondary | ICD-10-CM | POA: Diagnosis not present

## 2022-04-30 DIAGNOSIS — Z780 Asymptomatic menopausal state: Secondary | ICD-10-CM | POA: Diagnosis not present

## 2022-05-01 DIAGNOSIS — Z170 Estrogen receptor positive status [ER+]: Secondary | ICD-10-CM | POA: Diagnosis not present

## 2022-05-01 DIAGNOSIS — Z510 Encounter for antineoplastic radiation therapy: Secondary | ICD-10-CM | POA: Diagnosis not present

## 2022-05-01 DIAGNOSIS — Z9012 Acquired absence of left breast and nipple: Secondary | ICD-10-CM | POA: Diagnosis not present

## 2022-05-01 DIAGNOSIS — Z79811 Long term (current) use of aromatase inhibitors: Secondary | ICD-10-CM | POA: Diagnosis not present

## 2022-05-01 DIAGNOSIS — C7989 Secondary malignant neoplasm of other specified sites: Secondary | ICD-10-CM | POA: Diagnosis not present

## 2022-05-01 DIAGNOSIS — C50812 Malignant neoplasm of overlapping sites of left female breast: Secondary | ICD-10-CM | POA: Diagnosis not present

## 2022-05-01 DIAGNOSIS — F17210 Nicotine dependence, cigarettes, uncomplicated: Secondary | ICD-10-CM | POA: Diagnosis not present

## 2022-05-01 DIAGNOSIS — J449 Chronic obstructive pulmonary disease, unspecified: Secondary | ICD-10-CM | POA: Diagnosis not present

## 2022-05-01 DIAGNOSIS — Z780 Asymptomatic menopausal state: Secondary | ICD-10-CM | POA: Diagnosis not present

## 2022-05-01 DIAGNOSIS — C50912 Malignant neoplasm of unspecified site of left female breast: Secondary | ICD-10-CM | POA: Diagnosis not present

## 2022-05-04 DIAGNOSIS — F17210 Nicotine dependence, cigarettes, uncomplicated: Secondary | ICD-10-CM | POA: Diagnosis not present

## 2022-05-04 DIAGNOSIS — Z9012 Acquired absence of left breast and nipple: Secondary | ICD-10-CM | POA: Diagnosis not present

## 2022-05-04 DIAGNOSIS — C50812 Malignant neoplasm of overlapping sites of left female breast: Secondary | ICD-10-CM | POA: Diagnosis not present

## 2022-05-04 DIAGNOSIS — Z79811 Long term (current) use of aromatase inhibitors: Secondary | ICD-10-CM | POA: Diagnosis not present

## 2022-05-04 DIAGNOSIS — Z170 Estrogen receptor positive status [ER+]: Secondary | ICD-10-CM | POA: Diagnosis not present

## 2022-05-04 DIAGNOSIS — Z510 Encounter for antineoplastic radiation therapy: Secondary | ICD-10-CM | POA: Diagnosis not present

## 2022-05-04 DIAGNOSIS — C7989 Secondary malignant neoplasm of other specified sites: Secondary | ICD-10-CM | POA: Diagnosis not present

## 2022-05-04 DIAGNOSIS — Z780 Asymptomatic menopausal state: Secondary | ICD-10-CM | POA: Diagnosis not present

## 2022-05-04 DIAGNOSIS — J449 Chronic obstructive pulmonary disease, unspecified: Secondary | ICD-10-CM | POA: Diagnosis not present

## 2022-05-04 DIAGNOSIS — C50912 Malignant neoplasm of unspecified site of left female breast: Secondary | ICD-10-CM | POA: Diagnosis not present

## 2022-05-05 DIAGNOSIS — C50812 Malignant neoplasm of overlapping sites of left female breast: Secondary | ICD-10-CM | POA: Diagnosis not present

## 2022-05-05 DIAGNOSIS — C50912 Malignant neoplasm of unspecified site of left female breast: Secondary | ICD-10-CM | POA: Diagnosis not present

## 2022-05-05 DIAGNOSIS — Z170 Estrogen receptor positive status [ER+]: Secondary | ICD-10-CM | POA: Diagnosis not present

## 2022-05-05 DIAGNOSIS — Z79811 Long term (current) use of aromatase inhibitors: Secondary | ICD-10-CM | POA: Diagnosis not present

## 2022-05-05 DIAGNOSIS — Z510 Encounter for antineoplastic radiation therapy: Secondary | ICD-10-CM | POA: Diagnosis not present

## 2022-05-05 DIAGNOSIS — Z9012 Acquired absence of left breast and nipple: Secondary | ICD-10-CM | POA: Diagnosis not present

## 2022-05-05 DIAGNOSIS — J449 Chronic obstructive pulmonary disease, unspecified: Secondary | ICD-10-CM | POA: Diagnosis not present

## 2022-05-05 DIAGNOSIS — Z780 Asymptomatic menopausal state: Secondary | ICD-10-CM | POA: Diagnosis not present

## 2022-05-05 DIAGNOSIS — C7989 Secondary malignant neoplasm of other specified sites: Secondary | ICD-10-CM | POA: Diagnosis not present

## 2022-05-05 DIAGNOSIS — F17210 Nicotine dependence, cigarettes, uncomplicated: Secondary | ICD-10-CM | POA: Diagnosis not present

## 2022-05-06 DIAGNOSIS — Z170 Estrogen receptor positive status [ER+]: Secondary | ICD-10-CM | POA: Diagnosis not present

## 2022-05-06 DIAGNOSIS — J449 Chronic obstructive pulmonary disease, unspecified: Secondary | ICD-10-CM | POA: Diagnosis not present

## 2022-05-06 DIAGNOSIS — F17210 Nicotine dependence, cigarettes, uncomplicated: Secondary | ICD-10-CM | POA: Diagnosis not present

## 2022-05-06 DIAGNOSIS — Z780 Asymptomatic menopausal state: Secondary | ICD-10-CM | POA: Diagnosis not present

## 2022-05-06 DIAGNOSIS — C50912 Malignant neoplasm of unspecified site of left female breast: Secondary | ICD-10-CM | POA: Diagnosis not present

## 2022-05-06 DIAGNOSIS — C7989 Secondary malignant neoplasm of other specified sites: Secondary | ICD-10-CM | POA: Diagnosis not present

## 2022-05-06 DIAGNOSIS — Z510 Encounter for antineoplastic radiation therapy: Secondary | ICD-10-CM | POA: Diagnosis not present

## 2022-05-06 DIAGNOSIS — C50812 Malignant neoplasm of overlapping sites of left female breast: Secondary | ICD-10-CM | POA: Diagnosis not present

## 2022-05-06 DIAGNOSIS — Z79811 Long term (current) use of aromatase inhibitors: Secondary | ICD-10-CM | POA: Diagnosis not present

## 2022-05-06 DIAGNOSIS — Z9012 Acquired absence of left breast and nipple: Secondary | ICD-10-CM | POA: Diagnosis not present

## 2022-05-07 DIAGNOSIS — Z79811 Long term (current) use of aromatase inhibitors: Secondary | ICD-10-CM | POA: Diagnosis not present

## 2022-05-07 DIAGNOSIS — J449 Chronic obstructive pulmonary disease, unspecified: Secondary | ICD-10-CM | POA: Diagnosis not present

## 2022-05-07 DIAGNOSIS — Z510 Encounter for antineoplastic radiation therapy: Secondary | ICD-10-CM | POA: Diagnosis not present

## 2022-05-07 DIAGNOSIS — Z9012 Acquired absence of left breast and nipple: Secondary | ICD-10-CM | POA: Diagnosis not present

## 2022-05-07 DIAGNOSIS — F17210 Nicotine dependence, cigarettes, uncomplicated: Secondary | ICD-10-CM | POA: Diagnosis not present

## 2022-05-07 DIAGNOSIS — Z170 Estrogen receptor positive status [ER+]: Secondary | ICD-10-CM | POA: Diagnosis not present

## 2022-05-07 DIAGNOSIS — Z780 Asymptomatic menopausal state: Secondary | ICD-10-CM | POA: Diagnosis not present

## 2022-05-07 DIAGNOSIS — C50812 Malignant neoplasm of overlapping sites of left female breast: Secondary | ICD-10-CM | POA: Diagnosis not present

## 2022-05-07 DIAGNOSIS — C7989 Secondary malignant neoplasm of other specified sites: Secondary | ICD-10-CM | POA: Diagnosis not present

## 2022-05-08 DIAGNOSIS — C7989 Secondary malignant neoplasm of other specified sites: Secondary | ICD-10-CM | POA: Diagnosis not present

## 2022-05-08 DIAGNOSIS — Z9012 Acquired absence of left breast and nipple: Secondary | ICD-10-CM | POA: Diagnosis not present

## 2022-05-08 DIAGNOSIS — F17210 Nicotine dependence, cigarettes, uncomplicated: Secondary | ICD-10-CM | POA: Diagnosis not present

## 2022-05-08 DIAGNOSIS — Z170 Estrogen receptor positive status [ER+]: Secondary | ICD-10-CM | POA: Diagnosis not present

## 2022-05-08 DIAGNOSIS — Z510 Encounter for antineoplastic radiation therapy: Secondary | ICD-10-CM | POA: Diagnosis not present

## 2022-05-08 DIAGNOSIS — Z780 Asymptomatic menopausal state: Secondary | ICD-10-CM | POA: Diagnosis not present

## 2022-05-08 DIAGNOSIS — Z79811 Long term (current) use of aromatase inhibitors: Secondary | ICD-10-CM | POA: Diagnosis not present

## 2022-05-08 DIAGNOSIS — C50812 Malignant neoplasm of overlapping sites of left female breast: Secondary | ICD-10-CM | POA: Diagnosis not present

## 2022-05-08 DIAGNOSIS — J449 Chronic obstructive pulmonary disease, unspecified: Secondary | ICD-10-CM | POA: Diagnosis not present

## 2022-05-11 DIAGNOSIS — C7989 Secondary malignant neoplasm of other specified sites: Secondary | ICD-10-CM | POA: Diagnosis not present

## 2022-05-11 DIAGNOSIS — C50812 Malignant neoplasm of overlapping sites of left female breast: Secondary | ICD-10-CM | POA: Diagnosis not present

## 2022-05-11 DIAGNOSIS — Z170 Estrogen receptor positive status [ER+]: Secondary | ICD-10-CM | POA: Diagnosis not present

## 2022-05-11 DIAGNOSIS — Z79811 Long term (current) use of aromatase inhibitors: Secondary | ICD-10-CM | POA: Diagnosis not present

## 2022-05-11 DIAGNOSIS — Z510 Encounter for antineoplastic radiation therapy: Secondary | ICD-10-CM | POA: Diagnosis not present

## 2022-05-11 DIAGNOSIS — J449 Chronic obstructive pulmonary disease, unspecified: Secondary | ICD-10-CM | POA: Diagnosis not present

## 2022-05-11 DIAGNOSIS — Z9012 Acquired absence of left breast and nipple: Secondary | ICD-10-CM | POA: Diagnosis not present

## 2022-05-11 DIAGNOSIS — Z780 Asymptomatic menopausal state: Secondary | ICD-10-CM | POA: Diagnosis not present

## 2022-05-11 DIAGNOSIS — F17210 Nicotine dependence, cigarettes, uncomplicated: Secondary | ICD-10-CM | POA: Diagnosis not present

## 2022-05-12 DIAGNOSIS — Z170 Estrogen receptor positive status [ER+]: Secondary | ICD-10-CM | POA: Diagnosis not present

## 2022-05-12 DIAGNOSIS — C7989 Secondary malignant neoplasm of other specified sites: Secondary | ICD-10-CM | POA: Diagnosis not present

## 2022-05-12 DIAGNOSIS — Z9012 Acquired absence of left breast and nipple: Secondary | ICD-10-CM | POA: Diagnosis not present

## 2022-05-12 DIAGNOSIS — Z780 Asymptomatic menopausal state: Secondary | ICD-10-CM | POA: Diagnosis not present

## 2022-05-12 DIAGNOSIS — J449 Chronic obstructive pulmonary disease, unspecified: Secondary | ICD-10-CM | POA: Diagnosis not present

## 2022-05-12 DIAGNOSIS — C50812 Malignant neoplasm of overlapping sites of left female breast: Secondary | ICD-10-CM | POA: Diagnosis not present

## 2022-05-12 DIAGNOSIS — Z510 Encounter for antineoplastic radiation therapy: Secondary | ICD-10-CM | POA: Diagnosis not present

## 2022-05-12 DIAGNOSIS — Z79811 Long term (current) use of aromatase inhibitors: Secondary | ICD-10-CM | POA: Diagnosis not present

## 2022-05-12 DIAGNOSIS — F17210 Nicotine dependence, cigarettes, uncomplicated: Secondary | ICD-10-CM | POA: Diagnosis not present

## 2022-05-25 ENCOUNTER — Other Ambulatory Visit: Payer: Self-pay

## 2022-05-25 DIAGNOSIS — C50912 Malignant neoplasm of unspecified site of left female breast: Secondary | ICD-10-CM

## 2022-05-26 ENCOUNTER — Inpatient Hospital Stay: Payer: Medicare PPO | Admitting: Hematology

## 2022-05-26 ENCOUNTER — Inpatient Hospital Stay: Payer: Medicare PPO

## 2022-06-11 DIAGNOSIS — R5383 Other fatigue: Secondary | ICD-10-CM | POA: Diagnosis not present

## 2022-06-11 DIAGNOSIS — R0981 Nasal congestion: Secondary | ICD-10-CM | POA: Diagnosis not present

## 2022-06-11 DIAGNOSIS — J440 Chronic obstructive pulmonary disease with acute lower respiratory infection: Secondary | ICD-10-CM | POA: Diagnosis not present

## 2022-06-26 ENCOUNTER — Inpatient Hospital Stay: Payer: Medicare PPO | Attending: Hematology

## 2022-06-26 ENCOUNTER — Ambulatory Visit (HOSPITAL_COMMUNITY): Payer: Medicare PPO

## 2022-07-02 ENCOUNTER — Inpatient Hospital Stay: Payer: Medicare PPO | Admitting: Hematology

## 2022-07-02 DIAGNOSIS — J441 Chronic obstructive pulmonary disease with (acute) exacerbation: Secondary | ICD-10-CM | POA: Diagnosis not present

## 2022-07-02 DIAGNOSIS — Z299 Encounter for prophylactic measures, unspecified: Secondary | ICD-10-CM | POA: Diagnosis not present

## 2022-07-02 DIAGNOSIS — F17210 Nicotine dependence, cigarettes, uncomplicated: Secondary | ICD-10-CM | POA: Diagnosis not present

## 2022-07-27 DIAGNOSIS — M5416 Radiculopathy, lumbar region: Secondary | ICD-10-CM | POA: Diagnosis not present

## 2022-07-27 DIAGNOSIS — G894 Chronic pain syndrome: Secondary | ICD-10-CM | POA: Diagnosis not present

## 2022-07-27 DIAGNOSIS — Z853 Personal history of malignant neoplasm of breast: Secondary | ICD-10-CM | POA: Diagnosis not present

## 2022-07-31 ENCOUNTER — Ambulatory Visit (HOSPITAL_COMMUNITY)
Admission: RE | Admit: 2022-07-31 | Discharge: 2022-07-31 | Disposition: A | Discharge status: Home / Self Care | Payer: Medicare PPO | Source: Ambulatory Visit | Attending: Hematology | Admitting: Hematology

## 2022-07-31 ENCOUNTER — Inpatient Hospital Stay: Payer: Medicare PPO | Attending: Hematology

## 2022-07-31 DIAGNOSIS — Z79811 Long term (current) use of aromatase inhibitors: Secondary | ICD-10-CM | POA: Insufficient documentation

## 2022-07-31 DIAGNOSIS — M810 Age-related osteoporosis without current pathological fracture: Secondary | ICD-10-CM | POA: Diagnosis not present

## 2022-07-31 DIAGNOSIS — Z1231 Encounter for screening mammogram for malignant neoplasm of breast: Secondary | ICD-10-CM | POA: Insufficient documentation

## 2022-07-31 DIAGNOSIS — C50412 Malignant neoplasm of upper-outer quadrant of left female breast: Secondary | ICD-10-CM | POA: Insufficient documentation

## 2022-07-31 DIAGNOSIS — Z9012 Acquired absence of left breast and nipple: Secondary | ICD-10-CM | POA: Diagnosis not present

## 2022-07-31 DIAGNOSIS — C50912 Malignant neoplasm of unspecified site of left female breast: Secondary | ICD-10-CM | POA: Insufficient documentation

## 2022-07-31 DIAGNOSIS — Z170 Estrogen receptor positive status [ER+]: Secondary | ICD-10-CM | POA: Insufficient documentation

## 2022-07-31 LAB — COMPREHENSIVE METABOLIC PANEL
ALT: 11 U/L (ref 0–44)
AST: 20 U/L (ref 15–41)
Albumin: 4.1 g/dL (ref 3.5–5.0)
Alkaline Phosphatase: 63 U/L (ref 38–126)
Anion gap: 10 (ref 5–15)
BUN: 10 mg/dL (ref 8–23)
CO2: 32 mmol/L (ref 22–32)
Calcium: 9.5 mg/dL (ref 8.9–10.3)
Chloride: 96 mmol/L — ABNORMAL LOW (ref 98–111)
Creatinine, Ser: 0.65 mg/dL (ref 0.44–1.00)
GFR, Estimated: >60 mL/min (ref >60)
Glucose, Bld: 82 mg/dL (ref 70–99)
Potassium: 4.1 mmol/L (ref 3.5–5.1)
Sodium: 138 mmol/L (ref 135–145)
Total Bilirubin: 0.4 mg/dL (ref 0.3–1.2)
Total Protein: 7.5 g/dL (ref 6.5–8.1)

## 2022-07-31 LAB — CBC WITH DIFFERENTIAL/PLATELET
Abs Immature Granulocytes: 0.01 10*3/uL (ref 0.00–0.07)
Basophils Absolute: 0 10*3/uL (ref 0.0–0.1)
Basophils Relative: 1 %
Eosinophils Absolute: 0.1 10*3/uL (ref 0.0–0.5)
Eosinophils Relative: 3 %
HCT: 44.9 % (ref 36.0–46.0)
Hemoglobin: 14 g/dL (ref 12.0–15.0)
Immature Granulocytes: 0 %
Lymphocytes Relative: 31 %
Lymphs Abs: 1.5 10*3/uL (ref 0.7–4.0)
MCH: 30.4 pg (ref 26.0–34.0)
MCHC: 31.2 g/dL (ref 30.0–36.0)
MCV: 97.6 fL (ref 80.0–100.0)
Monocytes Absolute: 0.5 10*3/uL (ref 0.1–1.0)
Monocytes Relative: 10 %
Neutro Abs: 2.7 10*3/uL (ref 1.7–7.7)
Neutrophils Relative %: 55 %
Platelets: 265 10*3/uL (ref 150–400)
RBC: 4.6 MIL/uL (ref 3.87–5.11)
RDW: 12.8 % (ref 11.5–15.5)
WBC: 4.9 10*3/uL (ref 4.0–10.5)
nRBC: 0 % (ref 0.0–0.2)

## 2022-07-31 LAB — VITAMIN D 25 HYDROXY (VIT D DEFICIENCY, FRACTURES): Vit D, 25-Hydroxy: 57.01 ng/mL (ref 30–100)

## 2022-08-01 LAB — CANCER ANTIGEN 27.29: CA 27.29: 19.4 U/mL (ref 0.0–38.6)

## 2022-08-02 LAB — CANCER ANTIGEN 15-3: CA 15-3: 17.2 U/mL (ref 0.0–25.0)

## 2022-08-04 ENCOUNTER — Inpatient Hospital Stay: Payer: Medicare PPO | Admitting: Oncology

## 2022-08-04 NOTE — Progress Notes (Deleted)
Virtual Visit via Telephone Note  I connected with Virginia Blake on 08/04/22  at  9:30 AM EDT by telephone and verified that I am speaking with the correct person using two identifiers.  Location: Patient: home Provider: office   I discussed the limitations, risks, security and privacy concerns of performing an evaluation and management service by telephone and the availability of in person appointments. I also discussed with the patient that there may be a patient responsible charge related to this service. The patient expressed understanding and agreed to proceed.   History of Present Illness: [name] is a 72 y.o. female seen for follow-up of left breast cancer. She was last seen by me on 02/02/22. Today, she states that she is doing well overall. However, she is currently sick with gastroenteritis. She reports N/V/D and is trying to stay well hydrated. She has been around her grandchildren with similar symptoms.  Her appetite level is at 100%. Her energy level is at 60%. She has been compliant with the Anastrozole without any problems. She denies any hot flashes. She continues to have 7/10 chest wall pain.  Observations/Objective: She started taking anastrozole and is tolerating very well.  Denied any hot flashes.  Minor pain at the site of her chest wall resection.  Reportedly has gastroenteritis with vomiting and diarrhea this morning and hence could not come to the office.  Assessment and Plan: ASSESSMENT:  1.  Recurrent left breast cancer: -Patient recently noticed a nodule in the left breast for the past several months. -As it was growing, she had mammogram right breast and ultrasound guided biopsy of the left breast on 11/06/2019 at [facility], [place]. -Pathology on 11/01/2019 consistent with grade 2 invasive ductal carcinoma, ER 95%, PR 0%, Ki-67 10%, HER-2 2+ by IHC, negative by FISH. -PET scan on 01/08/2020 with tiny soft tissue nodule in the medial aspect of the left  mastectomy bed with low-level FDG accumulation compatible with biopsy-proven malignancy with no evidence of metastatic disease.  Tiny lung nodules nonspecific.   2.  Left breast cancer (stage Ia): -Status post left mastectomy and sentinel lymph node biopsy on 02/05/2015 by Dr. Gabriel Cirri in [place]. -Pathology shows 6 mm, grade 1, positive lymphovascular invasion, single focus, margins negative, ER more than 90%, PR 10%, Ki-67 26%, HER-2 2+, negative by FISH. -She did not take antiestrogen therapy as she was afraid of the side effects.  -Excision of the left breast nodule on 02/16/2020 by Dr. Lovell Sheehan. 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  Infiltrating ductal carcinoma involving superior and inferior margins and medial margin.  Tumor measures 1 x 0.8 x 0.8 cm.  ER was 95% positive, PR negative, HER-2 2+ and negative by FISH.  Ki-67 10%. - Reexcision of the left breast tissue shows grade 2 IDC on 05/10/2020.  Invasive carcinoma present at the inferior medial margin focally and less than 1 mm from the deep margin focally. - Left breast tissue excision on 07/19/2020 with no residual malignancy. - She took anastrozole for 10 days and stopped taking due to aching pains in the arms and bones. - She tried tamoxifen for few weeks in April 2023.  She has experienced body pains and stopped taking them in June 2023. - I reviewed operative report from Dr. Lovell Sheehan.  Several subcutaneous hard nodules were noted to be fixed against the chest wall.  3 small 5-7 mm hard subcutaneous nodules were excised.  2 of them were  fixated to the rib and chest wall.  Minimal pectoralis major muscle was present. - I reviewed pathology report which showed invasive ductal carcinoma, grade 2, ER 95% positive, PR negative, Ki-67 10%.  HER2 2+, FISH negative. - Left mastectomy site is well-healed.  Labs from 12/05/2021 shows normal tumor markers. - Anastrozole started on  02/02/2022.   3.  Social/family history: -Lives at home with ex-husband.  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 -Paternal uncle had cancer, type unknown to the patient.   4.  Osteoporosis: -Last bone density on 10/28/2016 shows T score -2.9. -She was not treated for osteoporosis. -DEXA scan on 11/20/2019 with T score -3.2.     PLAN:  1.  Recurrent left breast cancer, ER positive, PR and HER-2 negative: - I reviewed PET scan from 02/19/2022 which did not show any FDG avid sites of metastatic disease.  Mild nonspecific increased uptake associated with soft tissue thickening within the left chest wall. - I have strongly recommended that she have radiation therapy due to multiple recurrences in the chest wall.  Will make referral to radiation oncology. - We will arrange for right breast mammogram in April. - RTC 3 months for follow-up.   2.  Osteoporosis: - Continue calcium and vitamin D supplements.  We previously discussed Prolia after dental evaluation.  3.  Smoking history: - CT lung cancer screening scan from 04/07/2021 was lung RADS 2S. - She will have another scan this year.   Breast Cancer therapy associated bone loss: I have recommended calcium, Vitamin D and weight bearing exercises.  Follow Up Instructions: RTC 3 months   I discussed the assessment and treatment plan with the patient. The patient was provided an opportunity to ask questions and all were answered. The patient agreed with the plan and demonstrated an understanding of the instructions.   The patient was advised to call back or seek an in-person evaluation if the symptoms worsen or if the condition fails to improve as anticipated.  I provided 21 minutes of non-face-to-face time during this encounter.

## 2022-08-26 NOTE — Progress Notes (Incomplete)
[facility] [address] [place] [address]    Clinic Day:  08/27/2022  Referring physician: Toma Deiters, MD  Patient Care Team: Toma Deiters, MD as PCP - General (Internal Medicine) Therese Sarah, RN as Oncology Nurse Navigator (Oncology) Doreatha Massed, MD as Medical Oncologist (Medical Oncology)   ASSESSMENT & PLAN:   Assessment:  1.  Recurrent left breast cancer: -Patient recently noticed a nodule in the left breast for the past several months. -As it was growing, she had mammogram right breast and ultrasound guided biopsy of the left breast on 11/06/2019 at [facility], [place]. -Pathology on 11/01/2019 consistent with grade 2 invasive ductal carcinoma, ER 95%, PR 0%, Ki-67 10%, HER-2 2+ by IHC, negative by FISH. -PET scan on 01/08/2020 with tiny soft tissue nodule in the medial aspect of the left mastectomy bed with low-level FDG accumulation compatible with biopsy-proven malignancy with no evidence of metastatic disease.  Tiny lung nodules nonspecific.   2.  Left breast cancer (stage Ia): -Status post left mastectomy and sentinel lymph node biopsy on 02/05/2015 by Dr. Gabriel Cirri in [place]. -Pathology shows 6 mm, grade 1, positive lymphovascular invasion, single focus, margins negative, ER more than 90%, PR 10%, Ki-67 26%, HER-2 2+, negative by FISH. -She did not take antiestrogen therapy as she was afraid of the side effects.  -Excision of the left breast nodule on 02/16/2020 by Dr. Lovell Sheehan. 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  Infiltrating ductal carcinoma involving superior and inferior margins and medial margin.  Tumor measures 1 x 0.8 x 0.8 cm.  ER was 95% positive, PR negative, HER-2 2+ and negative by FISH.  Ki-67 10%. - Reexcision of the left breast tissue shows grade 2 IDC on 05/10/2020.  Invasive carcinoma present at the inferior medial margin focally and less than 1 mm from  the deep margin focally. - Left breast tissue excision on 07/19/2020 with no residual malignancy. - She took anastrozole for 10 days and stopped taking due to aching pains in the arms and bones. - She tried tamoxifen for few weeks in April 2023.  She has experienced body pains and stopped taking them in June 2023.   3.  Social/family history: -Lives at home with ex-husband.  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 -Paternal uncle had cancer, type unknown to the patient.   4.  Osteoporosis: -Last bone density on 10/28/2016 shows T score -2.9. -She was not treated for osteoporosis. -DEXA scan on 11/20/2019 with T score -3.2.  Plan:  1.  Recurrent left breast cancer, ER positive, PR and HER-2 negative: - I reviewed operative report from Dr. Lovell Sheehan.  Several subcutaneous hard nodules were noted to be fixed against the chest wall.  3 small 5-7 mm hard subcutaneous nodules were excised.  2 of them were fixated to the rib and chest wall.  Minimal pectoralis major muscle was present. - I reviewed pathology report which showed invasive ductal carcinoma, grade 2, ER 95% positive, PR negative, Ki-67 10%.  HER2 2+, FISH negative. - Left mastectomy site is well-healed.  Labs from 12/05/2021 shows normal tumor markers. - Recommend PET CT scan for restaging. - Right breast mammogram on 04/24/2021 was BI-RADS Category 1. - We talked about antiestrogen therapy.  She could not tolerate tamoxifen as it caused her fibromyalgia to get worse. - We talked about initiating her on anastrozole.  We discussed side effects  in detail.  She told me that she will try taking it at this time.  Initially she did not take it. - RTC after PET scan.  Will consider referral to radiation therapy if there is no other metastatic disease.   2.  Osteoporosis: - Continue calcium and vitamin D supplements.  We have previously discussed Prolia after dental evaluation.  Last vitamin D was 66.  3.   Smoking history: - CT lung cancer scan from 04/07/2021 was lung RADS 2S.  She has a 40-pack-year smoking history.   Breast Cancer therapy associated bone loss: I have recommended calcium, Vitamin D and weight bearing exercises.  No orders of the defined types were placed in this encounter.     Alben Deeds Teague,acting as a Neurosurgeon for Doreatha Massed, MD.,have documented all relevant documentation on the behalf of Doreatha Massed, MD,as directed by  Doreatha Massed, MD while in the presence of Doreatha Massed, MD.   ***  [place]   8/7/20241:04 PM  CHIEF COMPLAINT:   Diagnosis: Malignant neoplasm of left breast   Cancer Staging  Malignant neoplasm of left female breast [facility]) Staging form: Breast, AJCC 7th Edition - Clinical stage from 02/05/2015: Stage IA (T1b, N0, M0) - Signed by Ralene Cork, MD on 03/12/2016    Prior Therapy: ***  Current Therapy:  ***   HISTORY OF PRESENT ILLNESS:   Oncology History   No history exists.     INTERVAL HISTORY:   Virginia Blake is a 72 y.o. female presenting to clinic today for follow up of Malignant neoplasm of left breast . She was last seen by me on 02/02/22.  Since her last visit, she underwent unilateral right MM on 07/31/22 that found: no mammographic evidence of malignancy.   Today, she states that she is doing well overall. Her appetite level is at ***%. Her energy level is at ***%.  PAST MEDICAL HISTORY:   Past Medical History: Past Medical History:  Diagnosis Date   Breast cancer (HCC)    left mastectomy  2017   Chronic fatigue syndrome    Complication of anesthesia    history of difficulty waking up after surgery   COPD (chronic obstructive pulmonary disease) (HCC)    Family history of skin cancer    Fibromyalgia    Hx of skin cancer, basal cell    Right posterior upper arm   Osteoporosis     Surgical History: Past Surgical History:  Procedure Laterality Date   ABDOMINAL HYSTERECTOMY  1998    bladder tact  1985   DILATION AND CURETTAGE OF UTERUS     EXCISION OF BREAST BIOPSY Left 11/10/2021   Procedure: EXCISION OF BREAST BIOPSY, LEFT;  Surgeon: Franky Macho, MD;  Location: [facility];  Service: General;  Laterality: Left;   EYE SURGERY     INCONTINENCE SURGERY     MASTECTOMY, PARTIAL Left 02/16/2020   Procedure: MASTECTOMY PARTIAL;  Surgeon: Franky Macho, MD;  Location: [facility];  Service: General;  Laterality: Left;   MASTECTOMY, PARTIAL Left 05/10/2020   Procedure: MASTECTOMY PARTIAL;  Surgeon: Franky Macho, MD;  Location: [facility];  Service: General;  Laterality: Left;   MASTECTOMY, PARTIAL Left 07/19/2020   Procedure: MASTECTOMY PARTIAL;  Surgeon: Franky Macho, MD;  Location: [facility];  Service: General;  Laterality: Left;   TONSILLECTOMY     TOTAL MASTECTOMY Left    TUBAL LIGATION      Social History: Social History   Socioeconomic History   Marital status: Divorced  Spouse name: Not on file   Number of children: Not on file   Years of education: Not on file   Highest education level: Not on file  Occupational History   Not on file  Tobacco Use   Smoking status: Every Day    Current packs/day: 1.00    Types: Cigarettes   Smokeless tobacco: Never  Vaping Use   Vaping status: Never Used  Substance and Sexual Activity   Alcohol use: No   Drug use: No   Sexual activity: Not Currently  Other Topics Concern   Not on file  Social History Narrative   Not on file   Social Determinants of Health   Financial Resource Strain: Low Risk  (11/15/2019)   Overall Financial Resource Strain (CARDIA)    Difficulty of Paying Living Expenses: Not hard at all  Food Insecurity: No Food Insecurity (11/15/2019)   Hunger Vital Sign    Worried About Running Out of Food in the Last Year: Never true    Ran Out of Food in the Last Year: Never true  Transportation Needs: No Transportation Needs (11/15/2019)   PRAPARE - Administrator, Civil Service (Medical): No     Lack of Transportation (Non-Medical): No  Physical Activity: Inactive (11/15/2019)   Exercise Vital Sign    Days of Exercise per Week: 0 days    Minutes of Exercise per Session: 0 min  Stress: Stress Concern Present (11/15/2019)   [employer] of Occupational Health - Occupational Stress Questionnaire    Feeling of Stress : Very much  Social Connections: Moderately Isolated (11/15/2019)   Social Connection and Isolation Panel [NHANES]    Frequency of Communication with Friends and Family: More than three times a week    Frequency of Social Gatherings with Friends and Family: Once a week    Attends Religious Services: More than 4 times per year    Active Member of [employer] or Organizations: No    Attends Banker Meetings: Never    Marital Status: Divorced  Catering manager Violence: Not At Risk (11/15/2019)   Humiliation, Afraid, Rape, and Kick questionnaire    Fear of Current or Ex-Partner: No    Emotionally Abused: No    Physically Abused: No    Sexually Abused: No    Family History: Family History  Problem Relation Age of Onset   Skin cancer Brother    Skin cancer Paternal Uncle        nose   Heart attack Maternal Grandmother    Heart attack Maternal Grandfather    Stroke Paternal Grandfather    Skin cancer Brother    Cancer Paternal Uncle        unknown type    Current Medications:  Current Outpatient Medications:    albuterol (VENTOLIN HFA) 108 (90 Base) MCG/ACT inhaler, Inhale into the lungs every 4 (four) hours as needed for wheezing or shortness of breath., Disp: , Rfl:    ALPRAZolam (XANAX) 1 MG tablet, Take 1 mg by mouth 4 (four) times daily as needed for anxiety., Disp: , Rfl: 5   anastrozole (ARIMIDEX) 1 MG tablet, Take 1 tablet (1 mg total) by mouth daily., Disp: 30 tablet, Rfl: 6   cholecalciferol (VITAMIN D3) 25 MCG (1000 UNIT) tablet, Take 1,000 Units by mouth in the morning and at bedtime., Disp: , Rfl:    HYDROcodone-acetaminophen  (NORCO/VICODIN) 5-325 MG tablet, Take 2 tablets by mouth every 4 (four) hours., Disp: , Rfl:    ipratropium-albuterol (  DUONEB) 0.5-2.5 (3) MG/3ML SOLN, Inhale 3 mLs into the lungs every 6 (six) hours as needed (asthma)., Disp: , Rfl: 11   loperamide (IMODIUM A-D) 2 MG tablet, Take 2 mg by mouth 4 (four) times daily as needed for diarrhea or loose stools., Disp: , Rfl:    MAGNESIUM BISGLYCINATE PO, Take 1 tablet by mouth in the morning and at bedtime. MagBlue by [employer] (Elite Magnesium 350 mg/PurityBlue Blueberries 100 mg/Boron 3 mg/Vitamin D 50 mcg/Zinc 15 mg), Disp: , Rfl:    NADH POWD, Take 5 mg by mouth in the morning., Disp: , Rfl:    OVER THE COUNTER MEDICATION, Take 1 Scoop by mouth 2 (two) times a week. Super Beets, Disp: , Rfl:    prednisoLONE acetate (PRED FORTE) 1 % ophthalmic suspension, Place 1 drop into the left eye 4 (four) times daily., Disp: , Rfl:    sulfamethoxazole-trimethoprim (BACTRIM) 400-80 MG tablet, Take 1 tablet by mouth 2 (two) times daily., Disp: , Rfl:    SYMBICORT 80-4.5 MCG/ACT inhaler, Inhale 2 puffs into the lungs in the morning and at bedtime., Disp: , Rfl:    Allergies: Allergies  Allergen Reactions   Ciprofloxacin Anxiety   Aspirin     Stomach pains    Doxycycline     Made pt feel crazy    Nsaids     Stomach pains     REVIEW OF SYSTEMS:   Review of Systems  Constitutional:  Negative for chills, fatigue and fever.  HENT:   Negative for lump/mass, mouth sores, nosebleeds, sore throat and trouble swallowing.   Eyes:  Negative for eye problems.  Respiratory:  Negative for cough and shortness of breath.   Cardiovascular:  Negative for chest pain, leg swelling and palpitations.  Gastrointestinal:  Negative for abdominal pain, constipation, diarrhea, nausea and vomiting.  Genitourinary:  Negative for bladder incontinence, difficulty urinating, dysuria, frequency, hematuria and nocturia.   Musculoskeletal:  Negative for arthralgias, back pain,  flank pain, myalgias and neck pain.  Skin:  Negative for itching and rash.  Neurological:  Negative for dizziness, headaches and numbness.  Hematological:  Does not bruise/bleed easily.  Psychiatric/Behavioral:  Negative for depression, sleep disturbance and suicidal ideas. The patient is not nervous/anxious.   All other systems reviewed and are negative.    VITALS:   There were no vitals taken for this visit.  Wt Readings from Last 3 Encounters:  02/02/22 121 lb 12.8 oz (55.2 kg)  11/26/21 120 lb (54.4 kg)  11/05/21 118 lb (53.5 kg)    There is no height or weight on file to calculate BMI.  Performance status (ECOG): {CHL ONC [id number]  PHYSICAL EXAM:   Physical Exam Vitals and nursing note reviewed. Exam conducted with a chaperone present.  Constitutional:      Appearance: Normal appearance.  Cardiovascular:     Rate and Rhythm: Normal rate and regular rhythm.     Pulses: Normal pulses.     Heart sounds: Normal heart sounds.  Pulmonary:     Effort: Pulmonary effort is normal.     Breath sounds: Normal breath sounds.  Abdominal:     Palpations: Abdomen is soft. There is no hepatomegaly, splenomegaly or mass.     Tenderness: There is no abdominal tenderness.  Musculoskeletal:     Right lower leg: No edema.     Left lower leg: No edema.  Lymphadenopathy:     Cervical: No cervical adenopathy.     Right cervical: No superficial, deep or posterior  cervical adenopathy.    Left cervical: No superficial, deep or posterior cervical adenopathy.     Upper Body:     Right upper body: No supraclavicular or axillary adenopathy.     Left upper body: No supraclavicular or axillary adenopathy.  Neurological:     General: No focal deficit present.     Mental Status: She is alert and oriented to person, place, and time.  Psychiatric:        Mood and Affect: Mood normal.        Behavior: Behavior normal.     LABS:      Latest Ref Rng & Units 07/31/2022    2:37 PM 09/02/2021    11:16 AM 04/24/2021    1:29 PM  CBC  WBC 4.0 - 10.5 K/uL 4.9  5.2  5.7   Hemoglobin 12.0 - 15.0 g/dL 25.9  56.3  87.5   Hematocrit 36.0 - 46.0 % 44.9  42.0  52.0   Platelets 150 - 400 K/uL 265  231  254       Latest Ref Rng & Units 07/31/2022    2:37 PM 09/02/2021   11:16 AM 04/24/2021    1:29 PM  CMP  Glucose 70 - 99 mg/dL 82  643  329   BUN 8 - 23 mg/dL 10  9  11    Creatinine 0.44 - 1.00 mg/dL 5.18  8.41  6.60   Sodium 135 - 145 mmol/L 138  140  139   Potassium 3.5 - 5.1 mmol/L 4.1  4.8  5.1   Chloride 98 - 111 mmol/L 96  100  96   CO2 22 - 32 mmol/L 32  33  36   Calcium 8.9 - 10.3 mg/dL 9.5  9.3  9.8   Total Protein 6.5 - 8.1 g/dL 7.5  7.1  7.3   Total Bilirubin 0.3 - 1.2 mg/dL 0.4  0.4  0.5   Alkaline Phos 38 - 126 U/L 63  52  53   AST 15 - 41 U/L 20  21  25    ALT 0 - 44 U/L 11  13  16       No results found for: "CEA1", "CEA" / No results found for: "CEA1", "CEA" No results found for: "PSA1" No results found for: "YTK160" No results found for: "CAN125"  No results found for: "TOTALPROTELP", "ALBUMINELP", "A1GS", "A2GS", "BETS", "BETA2SER", "GAMS", "MSPIKE", "SPEI" No results found for: "TIBC", "FERRITIN", "IRONPCTSAT" No results found for: "LDH"   STUDIES:   MM 3D SCREEN BREAST UNI RIGHT  Result Date: 08/03/2022 CLINICAL DATA:  Screening. EXAM: DIGITAL SCREENING UNILATERAL RIGHT MAMMOGRAM WITH CAD AND TOMOSYNTHESIS TECHNIQUE: Right screening digital craniocaudal and mediolateral oblique mammograms were obtained. Right screening digital breast tomosynthesis was performed. The images were evaluated with computer-aided detection. COMPARISON:  Previous exam(s). ACR Breast Density Category b: There are scattered areas of fibroglandular density. FINDINGS: The patient has had a left mastectomy. There are no findings suspicious for malignancy. IMPRESSION: No mammographic evidence of malignancy. A result letter of this screening mammogram will be mailed directly to the patient.  RECOMMENDATION: Screening mammogram in one year.  (Code:SM-R-32M) BI-RADS CATEGORY  1: Negative. Electronically Signed   By: Norva Pavlov M.D.   On: 08/03/2022 13:23

## 2022-08-27 ENCOUNTER — Inpatient Hospital Stay: Payer: Medicare PPO | Attending: Hematology | Admitting: Hematology

## 2022-09-07 DIAGNOSIS — H2513 Age-related nuclear cataract, bilateral: Secondary | ICD-10-CM | POA: Diagnosis not present

## 2022-09-07 DIAGNOSIS — H35371 Puckering of macula, right eye: Secondary | ICD-10-CM | POA: Diagnosis not present

## 2022-09-07 DIAGNOSIS — H43391 Other vitreous opacities, right eye: Secondary | ICD-10-CM | POA: Diagnosis not present

## 2022-09-25 DIAGNOSIS — D692 Other nonthrombocytopenic purpura: Secondary | ICD-10-CM | POA: Diagnosis not present

## 2022-09-25 DIAGNOSIS — C50919 Malignant neoplasm of unspecified site of unspecified female breast: Secondary | ICD-10-CM | POA: Diagnosis not present

## 2022-09-25 DIAGNOSIS — J449 Chronic obstructive pulmonary disease, unspecified: Secondary | ICD-10-CM | POA: Diagnosis not present

## 2022-09-25 DIAGNOSIS — Z299 Encounter for prophylactic measures, unspecified: Secondary | ICD-10-CM | POA: Diagnosis not present

## 2022-09-25 DIAGNOSIS — J209 Acute bronchitis, unspecified: Secondary | ICD-10-CM | POA: Diagnosis not present

## 2022-09-28 DIAGNOSIS — Z5181 Encounter for therapeutic drug level monitoring: Secondary | ICD-10-CM | POA: Diagnosis not present

## 2022-09-28 DIAGNOSIS — F331 Major depressive disorder, recurrent, moderate: Secondary | ICD-10-CM | POA: Diagnosis not present

## 2022-10-05 DIAGNOSIS — Z1339 Encounter for screening examination for other mental health and behavioral disorders: Secondary | ICD-10-CM | POA: Diagnosis not present

## 2022-10-05 DIAGNOSIS — R3989 Other symptoms and signs involving the genitourinary system: Secondary | ICD-10-CM | POA: Diagnosis not present

## 2022-10-05 DIAGNOSIS — F17210 Nicotine dependence, cigarettes, uncomplicated: Secondary | ICD-10-CM | POA: Diagnosis not present

## 2022-10-05 DIAGNOSIS — F1320 Sedative, hypnotic or anxiolytic dependence, uncomplicated: Secondary | ICD-10-CM | POA: Diagnosis not present

## 2022-10-05 DIAGNOSIS — J449 Chronic obstructive pulmonary disease, unspecified: Secondary | ICD-10-CM | POA: Diagnosis not present

## 2022-10-05 DIAGNOSIS — Z299 Encounter for prophylactic measures, unspecified: Secondary | ICD-10-CM | POA: Diagnosis not present

## 2022-10-05 DIAGNOSIS — Z0000 Encounter for general adult medical examination without abnormal findings: Secondary | ICD-10-CM | POA: Diagnosis not present

## 2022-10-05 DIAGNOSIS — Z7189 Other specified counseling: Secondary | ICD-10-CM | POA: Diagnosis not present

## 2022-10-05 DIAGNOSIS — Z1331 Encounter for screening for depression: Secondary | ICD-10-CM | POA: Diagnosis not present

## 2022-10-26 DIAGNOSIS — M5416 Radiculopathy, lumbar region: Secondary | ICD-10-CM | POA: Diagnosis not present

## 2022-10-26 DIAGNOSIS — Z79899 Other long term (current) drug therapy: Secondary | ICD-10-CM | POA: Diagnosis not present

## 2022-12-04 DIAGNOSIS — E7800 Pure hypercholesterolemia, unspecified: Secondary | ICD-10-CM | POA: Diagnosis not present

## 2022-12-04 DIAGNOSIS — J209 Acute bronchitis, unspecified: Secondary | ICD-10-CM | POA: Diagnosis not present

## 2022-12-04 DIAGNOSIS — R350 Frequency of micturition: Secondary | ICD-10-CM | POA: Diagnosis not present

## 2022-12-04 DIAGNOSIS — R5383 Other fatigue: Secondary | ICD-10-CM | POA: Diagnosis not present

## 2022-12-04 DIAGNOSIS — Z79899 Other long term (current) drug therapy: Secondary | ICD-10-CM | POA: Diagnosis not present

## 2022-12-04 DIAGNOSIS — J440 Chronic obstructive pulmonary disease with acute lower respiratory infection: Secondary | ICD-10-CM | POA: Diagnosis not present

## 2022-12-04 DIAGNOSIS — F419 Anxiety disorder, unspecified: Secondary | ICD-10-CM | POA: Diagnosis not present

## 2022-12-04 DIAGNOSIS — F17210 Nicotine dependence, cigarettes, uncomplicated: Secondary | ICD-10-CM | POA: Diagnosis not present

## 2022-12-04 DIAGNOSIS — Z0000 Encounter for general adult medical examination without abnormal findings: Secondary | ICD-10-CM | POA: Diagnosis not present

## 2022-12-04 DIAGNOSIS — Z299 Encounter for prophylactic measures, unspecified: Secondary | ICD-10-CM | POA: Diagnosis not present

## 2023-01-05 DIAGNOSIS — D692 Other nonthrombocytopenic purpura: Secondary | ICD-10-CM | POA: Diagnosis not present

## 2023-01-05 DIAGNOSIS — R350 Frequency of micturition: Secondary | ICD-10-CM | POA: Diagnosis not present

## 2023-01-05 DIAGNOSIS — N390 Urinary tract infection, site not specified: Secondary | ICD-10-CM | POA: Diagnosis not present

## 2023-01-05 DIAGNOSIS — Z299 Encounter for prophylactic measures, unspecified: Secondary | ICD-10-CM | POA: Diagnosis not present

## 2023-03-29 DIAGNOSIS — F331 Major depressive disorder, recurrent, moderate: Secondary | ICD-10-CM | POA: Diagnosis not present

## 2023-04-01 DIAGNOSIS — Z299 Encounter for prophylactic measures, unspecified: Secondary | ICD-10-CM | POA: Diagnosis not present

## 2023-04-01 DIAGNOSIS — J209 Acute bronchitis, unspecified: Secondary | ICD-10-CM | POA: Diagnosis not present

## 2023-04-01 DIAGNOSIS — R059 Cough, unspecified: Secondary | ICD-10-CM | POA: Diagnosis not present

## 2023-04-01 DIAGNOSIS — J440 Chronic obstructive pulmonary disease with acute lower respiratory infection: Secondary | ICD-10-CM | POA: Diagnosis not present

## 2023-04-01 DIAGNOSIS — F17210 Nicotine dependence, cigarettes, uncomplicated: Secondary | ICD-10-CM | POA: Diagnosis not present

## 2023-04-01 DIAGNOSIS — C50919 Malignant neoplasm of unspecified site of unspecified female breast: Secondary | ICD-10-CM | POA: Diagnosis not present

## 2023-04-23 DIAGNOSIS — H35371 Puckering of macula, right eye: Secondary | ICD-10-CM | POA: Diagnosis not present

## 2023-05-21 DIAGNOSIS — H2512 Age-related nuclear cataract, left eye: Secondary | ICD-10-CM | POA: Diagnosis not present

## 2023-06-07 DIAGNOSIS — Z299 Encounter for prophylactic measures, unspecified: Secondary | ICD-10-CM | POA: Diagnosis not present

## 2023-06-07 DIAGNOSIS — N390 Urinary tract infection, site not specified: Secondary | ICD-10-CM | POA: Diagnosis not present

## 2023-06-07 DIAGNOSIS — J441 Chronic obstructive pulmonary disease with (acute) exacerbation: Secondary | ICD-10-CM | POA: Diagnosis not present

## 2023-06-07 DIAGNOSIS — J449 Chronic obstructive pulmonary disease, unspecified: Secondary | ICD-10-CM | POA: Diagnosis not present

## 2023-09-23 DIAGNOSIS — F1320 Sedative, hypnotic or anxiolytic dependence, uncomplicated: Secondary | ICD-10-CM | POA: Diagnosis not present

## 2023-09-23 DIAGNOSIS — F17210 Nicotine dependence, cigarettes, uncomplicated: Secondary | ICD-10-CM | POA: Diagnosis not present

## 2023-09-23 DIAGNOSIS — J440 Chronic obstructive pulmonary disease with acute lower respiratory infection: Secondary | ICD-10-CM | POA: Diagnosis not present

## 2023-09-23 DIAGNOSIS — R52 Pain, unspecified: Secondary | ICD-10-CM | POA: Diagnosis not present

## 2023-09-23 DIAGNOSIS — C50919 Malignant neoplasm of unspecified site of unspecified female breast: Secondary | ICD-10-CM | POA: Diagnosis not present

## 2023-09-23 DIAGNOSIS — R350 Frequency of micturition: Secondary | ICD-10-CM | POA: Diagnosis not present

## 2023-09-23 DIAGNOSIS — Z299 Encounter for prophylactic measures, unspecified: Secondary | ICD-10-CM | POA: Diagnosis not present

## 2023-09-30 ENCOUNTER — Other Ambulatory Visit: Payer: Self-pay | Admitting: Internal Medicine

## 2023-09-30 DIAGNOSIS — Z1231 Encounter for screening mammogram for malignant neoplasm of breast: Secondary | ICD-10-CM

## 2023-10-11 DIAGNOSIS — Z79899 Other long term (current) drug therapy: Secondary | ICD-10-CM | POA: Diagnosis not present

## 2023-10-11 DIAGNOSIS — M5416 Radiculopathy, lumbar region: Secondary | ICD-10-CM | POA: Diagnosis not present

## 2023-10-11 DIAGNOSIS — G894 Chronic pain syndrome: Secondary | ICD-10-CM | POA: Diagnosis not present

## 2023-10-11 DIAGNOSIS — Z853 Personal history of malignant neoplasm of breast: Secondary | ICD-10-CM | POA: Diagnosis not present

## 2023-10-12 ENCOUNTER — Encounter

## 2023-11-03 ENCOUNTER — Inpatient Hospital Stay: Admission: RE | Admit: 2023-11-03 | Source: Ambulatory Visit

## 2023-11-05 DIAGNOSIS — H35372 Puckering of macula, left eye: Secondary | ICD-10-CM | POA: Diagnosis not present

## 2023-11-05 DIAGNOSIS — H2512 Age-related nuclear cataract, left eye: Secondary | ICD-10-CM | POA: Diagnosis not present

## 2023-11-19 DIAGNOSIS — J440 Chronic obstructive pulmonary disease with acute lower respiratory infection: Secondary | ICD-10-CM | POA: Diagnosis not present

## 2023-11-19 DIAGNOSIS — F17210 Nicotine dependence, cigarettes, uncomplicated: Secondary | ICD-10-CM | POA: Diagnosis not present

## 2023-11-19 DIAGNOSIS — Z299 Encounter for prophylactic measures, unspecified: Secondary | ICD-10-CM | POA: Diagnosis not present

## 2023-11-23 ENCOUNTER — Ambulatory Visit
Admission: RE | Admit: 2023-11-23 | Discharge: 2023-11-23 | Disposition: A | Discharge status: Home / Self Care | Source: Ambulatory Visit | Attending: Internal Medicine | Admitting: Internal Medicine

## 2023-11-23 DIAGNOSIS — Z1231 Encounter for screening mammogram for malignant neoplasm of breast: Secondary | ICD-10-CM

## 2023-12-13 DIAGNOSIS — F331 Major depressive disorder, recurrent, moderate: Secondary | ICD-10-CM | POA: Diagnosis not present

## 2023-12-13 DIAGNOSIS — Z5181 Encounter for therapeutic drug level monitoring: Secondary | ICD-10-CM | POA: Diagnosis not present

## 2023-12-30 DIAGNOSIS — H2512 Age-related nuclear cataract, left eye: Secondary | ICD-10-CM | POA: Diagnosis not present
